# Patient Record
Sex: Male | Born: 1950 | Race: White | Hispanic: No | Smoking: Former smoker
Health system: Southern US, Community
[De-identification: ages and names within clinical notes are randomized; demographics above are authoritative.]

## PROBLEM LIST (undated history)

## (undated) DIAGNOSIS — I10 Essential (primary) hypertension: Secondary | ICD-10-CM

## (undated) DIAGNOSIS — Z87891 Personal history of nicotine dependence: Secondary | ICD-10-CM

## (undated) DIAGNOSIS — G4730 Sleep apnea, unspecified: Secondary | ICD-10-CM

## (undated) DIAGNOSIS — I214 Non-ST elevation (NSTEMI) myocardial infarction: Secondary | ICD-10-CM

## (undated) DIAGNOSIS — E785 Hyperlipidemia, unspecified: Secondary | ICD-10-CM

## (undated) HISTORY — DX: Personal history of nicotine dependence: Z87.891

## (undated) HISTORY — DX: Essential (primary) hypertension: I10

## (undated) HISTORY — DX: Hyperlipidemia, unspecified: E78.5

---

## 2017-03-19 ENCOUNTER — Emergency Department (HOSPITAL_COMMUNITY): Payer: Self-pay

## 2017-03-19 ENCOUNTER — Inpatient Hospital Stay (HOSPITAL_COMMUNITY)
Admission: EM | Admit: 2017-03-19 | Discharge: 2017-03-21 | DRG: 247 | Disposition: A | Discharge status: Home / Self Care | Payer: Self-pay | Attending: Cardiovascular Disease | Admitting: Cardiovascular Disease

## 2017-03-19 ENCOUNTER — Other Ambulatory Visit: Payer: Self-pay

## 2017-03-19 DIAGNOSIS — E785 Hyperlipidemia, unspecified: Secondary | ICD-10-CM | POA: Diagnosis present

## 2017-03-19 DIAGNOSIS — Z79899 Other long term (current) drug therapy: Secondary | ICD-10-CM

## 2017-03-19 DIAGNOSIS — F17210 Nicotine dependence, cigarettes, uncomplicated: Secondary | ICD-10-CM | POA: Diagnosis present

## 2017-03-19 DIAGNOSIS — I10 Essential (primary) hypertension: Secondary | ICD-10-CM | POA: Diagnosis present

## 2017-03-19 DIAGNOSIS — I214 Non-ST elevation (NSTEMI) myocardial infarction: Secondary | ICD-10-CM

## 2017-03-19 DIAGNOSIS — I2510 Atherosclerotic heart disease of native coronary artery without angina pectoris: Secondary | ICD-10-CM | POA: Diagnosis present

## 2017-03-19 DIAGNOSIS — Z87891 Personal history of nicotine dependence: Secondary | ICD-10-CM | POA: Diagnosis present

## 2017-03-19 DIAGNOSIS — Z91030 Bee allergy status: Secondary | ICD-10-CM

## 2017-03-19 DIAGNOSIS — Z8249 Family history of ischemic heart disease and other diseases of the circulatory system: Secondary | ICD-10-CM

## 2017-03-19 DIAGNOSIS — I472 Ventricular tachycardia: Secondary | ICD-10-CM | POA: Diagnosis not present

## 2017-03-19 HISTORY — DX: Non-ST elevation (NSTEMI) myocardial infarction: I21.4

## 2017-03-19 HISTORY — DX: Sleep apnea, unspecified: G47.30

## 2017-03-19 LAB — BASIC METABOLIC PANEL WITH GFR
Anion gap: 13 (ref 5–15)
BUN: 14 mg/dL (ref 6–20)
CO2: 20 mmol/L — ABNORMAL LOW (ref 22–32)
Calcium: 8.7 mg/dL — ABNORMAL LOW (ref 8.9–10.3)
Chloride: 100 mmol/L — ABNORMAL LOW (ref 101–111)
Creatinine, Ser: 0.98 mg/dL (ref 0.61–1.24)
GFR calc Af Amer: >60 mL/min
GFR calc non Af Amer: >60 mL/min
Glucose, Bld: 113 mg/dL — ABNORMAL HIGH (ref 65–99)
Potassium: 3.9 mmol/L (ref 3.5–5.1)
Sodium: 133 mmol/L — ABNORMAL LOW (ref 135–145)

## 2017-03-19 LAB — CBC
HCT: 40.9 % (ref 39.0–52.0)
Hemoglobin: 14.1 g/dL (ref 13.0–17.0)
MCH: 33.1 pg (ref 26.0–34.0)
MCHC: 34.5 g/dL (ref 30.0–36.0)
MCV: 96 fL (ref 78.0–100.0)
Platelets: 226 K/uL (ref 150–400)
RBC: 4.26 MIL/uL (ref 4.22–5.81)
RDW: 13.4 % (ref 11.5–15.5)
WBC: 8.7 K/uL (ref 4.0–10.5)

## 2017-03-19 LAB — I-STAT TROPONIN, ED: Troponin i, poc: 2.85 ng/mL (ref 0.00–0.08)

## 2017-03-19 MED ORDER — LISINOPRIL 5 MG PO TABS
5.0000 mg | ORAL_TABLET | Freq: Every day | ORAL | Status: DC
  Administered 2017-03-21: 09:00:00 5 mg via ORAL
  Filled 2017-03-19: qty 1

## 2017-03-19 MED ORDER — NITROGLYCERIN 0.4 MG SL SUBL
0.4000 mg | SUBLINGUAL_TABLET | SUBLINGUAL | Status: DC | PRN
  Administered 2017-03-19 – 2017-03-20 (×2): 0.4 mg via SUBLINGUAL
  Filled 2017-03-19: qty 1

## 2017-03-19 MED ORDER — METOPROLOL TARTRATE 12.5 MG HALF TABLET
12.5000 mg | ORAL_TABLET | Freq: Two times a day (BID) | ORAL | Status: DC
  Administered 2017-03-20 (×2): 12.5 mg via ORAL
  Filled 2017-03-19 (×2): qty 1

## 2017-03-19 MED ORDER — ONDANSETRON HCL 4 MG/2ML IJ SOLN: 4.0000 mg | Freq: Four times a day (QID) | INTRAMUSCULAR | Status: DC | PRN

## 2017-03-19 MED ORDER — NICOTINE 21 MG/24HR TD PT24
21.0000 mg | MEDICATED_PATCH | Freq: Every day | TRANSDERMAL | Status: DC
  Administered 2017-03-20 – 2017-03-21 (×3): 21 mg via TRANSDERMAL
  Filled 2017-03-19 (×3): qty 1

## 2017-03-19 MED ORDER — ASPIRIN 81 MG PO CHEW
324.0000 mg | CHEWABLE_TABLET | Freq: Once | ORAL | Status: AC
  Administered 2017-03-19: 324 mg via ORAL
  Filled 2017-03-19: qty 4

## 2017-03-19 MED ORDER — ASPIRIN EC 81 MG PO TBEC
81.0000 mg | DELAYED_RELEASE_TABLET | Freq: Every day | ORAL | Status: DC
  Administered 2017-03-21: 09:00:00 81 mg via ORAL
  Filled 2017-03-19: qty 1

## 2017-03-19 MED ORDER — HEPARIN BOLUS VIA INFUSION
4000.0000 [IU] | Freq: Once | INTRAVENOUS | Status: AC
  Administered 2017-03-19: 4000 [IU] via INTRAVENOUS
  Filled 2017-03-19: qty 4000

## 2017-03-19 MED ORDER — ACETAMINOPHEN 325 MG PO TABS
650.0000 mg | ORAL_TABLET | ORAL | Status: DC | PRN
Start: 2017-03-19 — End: 2017-03-21

## 2017-03-19 MED ORDER — HEPARIN (PORCINE) IN NACL 100-0.45 UNIT/ML-% IJ SOLN
1500.0000 [IU]/h | INTRAMUSCULAR | Status: DC
  Administered 2017-03-19: 1250 [IU]/h via INTRAVENOUS
  Filled 2017-03-19 (×2): qty 250

## 2017-03-19 MED ORDER — ATORVASTATIN CALCIUM 80 MG PO TABS
80.0000 mg | ORAL_TABLET | Freq: Every day | ORAL | Status: DC
  Administered 2017-03-20: 80 mg via ORAL
  Filled 2017-03-19 (×2): qty 1

## 2017-03-19 NOTE — ED Provider Notes (Signed)
[facility] EMERGENCY DEPARTMENT Provider Note   CSN: [id number] Arrival date & time: 03/19/17  1948     History   Chief Complaint Chief Complaint  Patient presents with  . Chest Pain    HPI LEIB ELAHI is a 67 y.o. male.  HPI  67 year old male presents with chest pain and diaphoresis.  Started around 2 PM this afternoon while at work.  The diaphoresis and chest discomfort started the same time and was rated as a 6 or 7 out of.  He has a hard time describing it but it felt uncomfortable and somewhat squeezing.  No associated shortness of breath or nausea/vomiting but he did have some pain in between his scapula.  The pain has improved and is now about a 4 out of 10 but is still present.  Diaphoresis has resolved.  He smokes but denies any other significant past medical history but also has not seen a doctor in 15 years.  His dad died of an MI in his 45s.  Patient took some Excedrin that had aspirin in it earlier today.  No past medical history on file.  Patient Active Problem List   Diagnosis Date Noted  . NSTEMI (non-ST elevated myocardial infarction) (HCC) 03/19/2017        Home Medications    Prior to Admission medications   Medication Sig Start Date End Date Taking? Authorizing Provider  aspirin-acetaminophen-caffeine (EXCEDRIN MIGRAINE) [phone] MG tablet Take 1 tablet by mouth every 6 (six) hours as needed for headache.   Yes [provider]    Family History No family history on file.  Social History Social History   Tobacco Use  . Smoking status: Not on file  Substance Use Topics  . Alcohol use: Not on file  . Drug use: Not on file     Allergies   Bee venom   Review of Systems Review of Systems  Constitutional: Positive for diaphoresis.  Respiratory: Negative for shortness of breath.   Cardiovascular: Positive for chest pain. Negative for leg swelling.  Gastrointestinal: Negative for nausea and vomiting.    Musculoskeletal: Positive for back pain.  All other systems reviewed and are negative.    Physical Exam Updated Vital Signs BP 138/80 (BP Location: Right Arm)   Temp 98.2 F (36.8 C) (Oral)   Resp 18   Ht 6' (1.829 m)   Wt 93 kg (205 lb)   SpO2 98%   BMI 27.80 kg/m   Physical Exam  Constitutional: He is oriented to person, place, and time. He appears well-developed and well-nourished.  Non-toxic appearance. He does not appear ill. No distress.  HENT:  Head: Normocephalic and atraumatic.  Right Ear: External ear normal.  Left Ear: External ear normal.  Nose: Nose normal.  Eyes: Right eye exhibits no discharge. Left eye exhibits no discharge.  Neck: Neck supple.  Cardiovascular: Normal rate, regular rhythm and normal heart sounds.  Pulses:      Radial pulses are 2+ on the right side, and 2+ on the left side.  Pulmonary/Chest: Effort normal and breath sounds normal.  Abdominal: Soft. There is no tenderness.  Musculoskeletal: He exhibits no edema.       Right lower leg: He exhibits no edema.       Left lower leg: He exhibits no edema.  Neurological: He is alert and oriented to person, place, and time.  Skin: Skin is warm and dry.  Nursing note and vitals reviewed.    ED Treatments /  Results  Labs (all labs ordered are listed, but only abnormal results are displayed) Labs Reviewed  BASIC METABOLIC PANEL - Abnormal; Notable for the following components:      Result Value   Sodium 133 (*)    Chloride 100 (*)    CO2 20 (*)    Glucose, Bld 113 (*)    Calcium 8.7 (*)    All other components within normal limits  I-STAT TROPONIN, ED - Abnormal; Notable for the following components:   Troponin i, poc 2.85 (*)    All other components within normal limits  CBC  HEPARIN LEVEL (UNFRACTIONATED)  CBC  HIV ANTIBODY (ROUTINE TESTING)  BASIC METABOLIC PANEL  LIPID PANEL  PROTIME-INR    EKG  EKG Interpretation  Date/Time:  Thursday March 19 2017 19:52:30  EST Ventricular Rate:  67 PR Interval:  158 QRS Duration: 96 QT Interval:  384 QTC Calculation: 405 R Axis:   28 Text Interpretation:  Normal sinus rhythm Possible Inferior infarct , age undetermined Abnormal ECG No old tracing to compare Confirmed by Pricilla LovelessGoldston, Kendahl Bumgardner [phone]([id number]) on 03/19/2017 8:48:53 PM       EKG Interpretation  Date/Time:  Thursday March 19 2017 20:56:58 EST Ventricular Rate:  69 PR Interval:  158 QRS Duration: 105 QT Interval:  394 QTC Calculation: 423 R Axis:   31 Text Interpretation:  Sinus rhythm Abnormal inferior Q waves no significant change since earlier in the day Confirmed by Pricilla LovelessGoldston, Suann Klier [phone]([id number]) on 03/19/2017 9:09:03 PM        Radiology Dg Chest Portable 1 View  Result Date: 03/19/2017 CLINICAL DATA:  Acute onset of midsternal chest pain, diaphoresis and shortness of breath. EXAM: PORTABLE CHEST 1 VIEW COMPARISON:  None. FINDINGS: The lungs are well-aerated. Minimal left basilar opacity may reflect atelectasis. There is no evidence of pleural effusion or pneumothorax. The cardiomediastinal silhouette is within normal limits. No acute osseous abnormalities are seen. IMPRESSION: Minimal left basilar opacity may reflect atelectasis. Electronically Signed   By: Roanna RaiderJeffery  Chang M.D.   On: 03/19/2017 21:37    Procedures .Critical Care Performed by: Pricilla LovelessGoldston, Mercadies Co, MD Authorized by: Pricilla LovelessGoldston, Daishaun Ayre, MD   Critical care provider statement:    Critical care time (minutes):  30   Critical care was necessary to treat or prevent imminent or life-threatening deterioration of the following conditions:  Cardiac failure and circulatory failure   Critical care was time spent personally by me on the following activities:  Development of treatment plan with patient or surrogate   (including critical care time)  Medications Ordered in ED Medications  nitroGLYCERIN (NITROSTAT) SL tablet 0.4 mg (0.4 mg Sublingual Given 03/19/17 2113)  heparin ADULT infusion 100 units/mL  (25000 units/25150mL sodium chloride 0.45%) (1,250 Units/hr Intravenous New Bag/Given 03/19/17 2130)  aspirin EC tablet 81 mg (not administered)  acetaminophen (TYLENOL) tablet 650 mg (not administered)  ondansetron (ZOFRAN) injection 4 mg (not administered)  atorvastatin (LIPITOR) tablet 80 mg (not administered)  metoprolol tartrate (LOPRESSOR) tablet 12.5 mg (not administered)  lisinopril (PRINIVIL,ZESTRIL) tablet 5 mg (not administered)  aspirin chewable tablet 324 mg (324 mg Oral Given 03/19/17 2111)  heparin bolus via infusion 4,000 Units (4,000 Units Intravenous Bolus from Bag 03/19/17 2130)     Initial Impression / Assessment and Plan / ED Course  I have reviewed the triage vital signs and the nursing notes.  Pertinent labs & imaging results that were available during my care of the patient were reviewed by me and considered in my medical decision making (  see chart for details).     Patient's presentation is consistent with an NSTEMI.  He did have some back pain but it was not severe and did not seem like a tearing pain coming from his chest.  My suspicion is this is not a dissection.  He is moderately hypertensive with an initial blood pressure of around 140 systolic but has equal pulses bilaterally.  He does not appear in distress.  I think this is myocardial infarction instead.  After one nitroglycerin his pain has resolved.  He was also given 324 mg aspirin and started on heparin drip.  He is currently pain-free.  His ECG shows nonspecific changes but no STEMI or significant depressions.  Discussed with cardiology who will admit.  Final Clinical Impressions(s) / ED Diagnoses   Final diagnoses:  NSTEMI (non-ST elevated myocardial infarction) [facility])    ED Discharge Orders    None       Pricilla Loveless, MD 03/19/17 2228

## 2017-03-19 NOTE — ED Notes (Signed)
Xray bedside.

## 2017-03-19 NOTE — H&P (Signed)
Cardiology History & Physical    Patient ID: Garrett Robertson MRN: [id number], DOB: [birth year] Date of Encounter: 03/19/2017, 11:02 PM Primary Physician: Patient, No Pcp Per  Chief Complaint: Pain   HPI: Garrett Robertson is a 67 y.o. male smoker with no significant past medical history, presents with acute chest pain.  The patient was in his usual state of health until this afternoon when he was at rest, and noticed the onset of acute substernal chest pain accompanied by excessive diaphoresis.  The pain persisted for several hours, and he had intermittent episodes of significant diaphoresis.  At some point, the pain there was radiation of his pain between his shoulder blades, but it did not radiate elsewhere.  This the pain was also associated with mild dyspnea and intermittent lightheadedness.  He has never had an episode of chest discomfort like this in the past.  Due to the persistence of the pain over several hours the patient had his girlfriend drive him to the emergency [facility] ED for further evaluation.  Initial vital signs were notable for mild hypertension, but were otherwise within normal limits.  His labs showed sodium of 133 ,normal creatinine of 1.0, normal CBC, and a troponin of 2.85.  EKG showed borderline inferior Q waves without any obvious ST elevation.  He received 1 nitroglycerin with relief of his chest pain and was started on IV heparin.  Upon my interview, the patient denied any chest pain.  Home Meds: Prior to Admission medications   Medication Sig Start Date End Date Taking? Authorizing Provider  aspirin-acetaminophen-caffeine (EXCEDRIN MIGRAINE) [phone] MG tablet Take 1 tablet by mouth every 6 (six) hours as needed for headache.   Yes [provider]    Allergies:  Allergies  Allergen Reactions  . Bee Venom     Social History   Socioeconomic History  . Marital status: Legally Separated    Spouse name: Not on file  . Number of children: Not on file  . Years  of education: Not on file  . Highest education level: Not on file  Social Needs  . Financial resource strain: Not on file  . Food insecurity - worry: Not on file  . Food insecurity - inability: Not on file  . Transportation needs - medical: Not on file  . Transportation needs - non-medical: Not on file  Occupational History  . Not on file  Tobacco Use  . Smoking status: Not on file  Substance and Sexual Activity  . Alcohol use: Not on file  . Drug use: Not on file  . Sexual activity: Not on file  Other Topics Concern  . Not on file  Social History Narrative  . Not on file    Works as a Licensed conveyancer.  Smoked 1-1-1/2 packs a day for several decades.  Drinks about 1 case of beer per week.  No family history on file.  Review of Systems: All other systems reviewed and are otherwise negative except as noted above.  Labs:   Lab Results  Component Value Date   WBC 8.7 03/19/2017   HGB 14.1 03/19/2017   HCT 40.9 03/19/2017   MCV 96.0 03/19/2017   PLT 226 03/19/2017    Recent Labs  Lab 03/19/17 2007  NA 133*  K 3.9  CL 100*  CO2 20*  BUN 14  CREATININE 0.98  CALCIUM 8.7*  GLUCOSE 113*   No results for input(s): CKTOTAL, CKMB, TROPONINI in the last 72 hours. No results found  for: CHOL, HDL, LDLCALC, TRIG No results found for: DDIMER  Radiology/Studies:  Dg Chest Portable 1 View  Result Date: 03/19/2017 CLINICAL DATA:  Acute onset of midsternal chest pain, diaphoresis and shortness of breath. EXAM: PORTABLE CHEST 1 VIEW COMPARISON:  None. FINDINGS: The lungs are well-aerated. Minimal left basilar opacity may reflect atelectasis. There is no evidence of pleural effusion or pneumothorax. The cardiomediastinal silhouette is within normal limits. No acute osseous abnormalities are seen. IMPRESSION: Minimal left basilar opacity may reflect atelectasis. Electronically Signed   By: Roanna RaiderJeffery  Chang M.D.   On: 03/19/2017 21:37   Wt Readings from Last 3 Encounters:  03/19/17 93 kg  (205 lb)    EKG: Normal sinus rhythm, borderline inferior Q waves.  Non-specific ST and T wave changes.  Physical Exam: Blood pressure (!) 147/97, pulse 82, temperature 98.2 F (36.8 C), temperature source Oral, resp. rate (!) 21, height 6' (1.829 m), weight 93 kg (205 lb), SpO2 96 %. Body mass index is 27.8 kg/m. General: Well developed, well nourished, in no acute distress. Head: Normocephalic, atraumatic, sclera non-icteric, no xanthomas, nares are without discharge.  Neck: Negative for carotid bruits. JVD not elevated. Lungs: Clear bilaterally to auscultation without wheezes, rales, or rhonchi. Breathing is unlabored. Heart: RRR with S1 S2. No murmurs, rubs, or gallops appreciated. Abdomen: Soft, non-tender, non-distended with normoactive bowel sounds. No hepatomegaly. No rebound/guarding. No obvious abdominal masses. Msk:  Strength and tone appear normal for age. Extremities: No clubbing or cyanosis. No edema.  Distal pedal pulses are 2+ and equal bilaterally. Neuro: Alert and oriented X 3. No focal deficit. No facial asymmetry. Moves all extremities spontaneously. Psych:  Responds to questions appropriately with a normal affect.    Assessment and Plan  67 y.o. male smoker with no significant past medical history, presents with acute chest pain, found to have NSTEMI.  1.  NSTEMI: Chest pain-free at present.  Continue IV heparin drip.  Plan for cardiac catheterization in the morning or sooner if he has recurrent chest pain refractory to medical management.  Echo ordered.  Continue aspirin, start high-dose atorvastatin, start low-dose metoprolol and ACE inhibitor.  2.  Active smoking: Nicotine supplementation for now, consider pharmacologic aid in smoking cessation upon discharge.   Signed, Garrett PlantsJaidip Elvina Bosch, MD 03/19/2017, 11:02 PM

## 2017-03-19 NOTE — ED Notes (Signed)
Cardiology @ bedside.

## 2017-03-19 NOTE — ED Triage Notes (Signed)
Pt reports midsternal chest pain, sweating, and shortness of breath, that started around 2pm today. Described as squeezing with radiation to his back. Pt took 2 325 ASA at home before he came to the hospital.

## 2017-03-19 NOTE — Progress Notes (Signed)
ANTICOAGULATION CONSULT NOTE - Initial Consult  Pharmacy Consult for heparin Indication: chest pain/ACS  Allergies  Allergen Reactions  . Bee Venom     Patient Measurements: Height: 6' (182.9 cm) Weight: 205 lb (93 kg) IBW/kg (Calculated) : 77.6 Heparin Dosing Weight: 93 Kg  Vital Signs: Temp: 98.2 F (36.8 C) (02/07 1957) Temp Source: Oral (02/07 1957) BP: 138/80 (02/07 1957)  Labs: Recent Labs    03/19/17 2007  HGB 14.1  HCT 40.9  PLT 226    CrCl cannot be calculated (No order found.).   Medical History: No past medical history on file.  Assessment: Garrett Robertson who has not been to the doctor in over a decade and not taking any medications PTA presents to the Ed with chest pain. Trop 2.85, CBC WNL; pharmacy to dose heparin  Goal of Therapy:  Heparin level 0.3-0.7 units/ml Monitor platelets by anticoagulation protocol: Yes   Plan:  Give 4000 units bolus x 1 Start heparin infusion at 1250 units/hr Check anti-Xa level in 6 hours and daily while on heparin Continue to monitor H&H and platelets  Garrett Robertson L Garrett Robertson 03/19/2017,9:11 PM

## 2017-03-20 ENCOUNTER — Encounter (HOSPITAL_COMMUNITY): Payer: Self-pay | Admitting: Cardiology

## 2017-03-20 ENCOUNTER — Other Ambulatory Visit (HOSPITAL_COMMUNITY): Payer: Self-pay

## 2017-03-20 ENCOUNTER — Encounter (HOSPITAL_COMMUNITY)
Admission: EM | Disposition: A | Discharge status: Home / Self Care | Payer: Self-pay | Source: Home / Self Care | Attending: Cardiology

## 2017-03-20 DIAGNOSIS — E782 Mixed hyperlipidemia: Secondary | ICD-10-CM

## 2017-03-20 DIAGNOSIS — Z720 Tobacco use: Secondary | ICD-10-CM

## 2017-03-20 DIAGNOSIS — I2510 Atherosclerotic heart disease of native coronary artery without angina pectoris: Secondary | ICD-10-CM

## 2017-03-20 DIAGNOSIS — I214 Non-ST elevation (NSTEMI) myocardial infarction: Principal | ICD-10-CM

## 2017-03-20 HISTORY — PX: CORONARY STENT INTERVENTION: CATH118234

## 2017-03-20 HISTORY — PX: CORONARY ANGIOPLASTY WITH STENT PLACEMENT: SHX49

## 2017-03-20 HISTORY — PX: LEFT HEART CATH AND CORONARY ANGIOGRAPHY: CATH118249

## 2017-03-20 LAB — POCT ACTIVATED CLOTTING TIME: Activated Clotting Time: 246 seconds

## 2017-03-20 LAB — BASIC METABOLIC PANEL
Anion gap: 10 (ref 5–15)
BUN: 9 mg/dL (ref 6–20)
CO2: 22 mmol/L (ref 22–32)
Calcium: 8.7 mg/dL — ABNORMAL LOW (ref 8.9–10.3)
Chloride: 105 mmol/L (ref 101–111)
Creatinine, Ser: 0.93 mg/dL (ref 0.61–1.24)
GFR calc Af Amer: >60 mL/min (ref >60)
GFR calc non Af Amer: >60 mL/min (ref >60)
Glucose, Bld: 107 mg/dL — ABNORMAL HIGH (ref 65–99)
Potassium: 3.7 mmol/L (ref 3.5–5.1)
Sodium: 137 mmol/L (ref 135–145)

## 2017-03-20 LAB — PROTIME-INR
INR: 0.96
Prothrombin Time: 12.7 seconds (ref 11.4–15.2)

## 2017-03-20 LAB — LIPID PANEL
Cholesterol: 178 mg/dL (ref 0–200)
HDL: 40 mg/dL — ABNORMAL LOW (ref >40)
LDL Cholesterol: 123 mg/dL — ABNORMAL HIGH (ref 0–99)
Total CHOL/HDL Ratio: 4.5 RATIO
Triglycerides: 77 mg/dL (ref <150)
VLDL: 15 mg/dL (ref 0–40)

## 2017-03-20 LAB — CBC
HCT: 40.3 % (ref 39.0–52.0)
Hemoglobin: 13.8 g/dL (ref 13.0–17.0)
MCH: 32.7 pg (ref 26.0–34.0)
MCHC: 34.2 g/dL (ref 30.0–36.0)
MCV: 95.5 fL (ref 78.0–100.0)
PLATELETS: 209 10*3/uL (ref 150–400)
RBC: 4.22 MIL/uL (ref 4.22–5.81)
RDW: 13.2 % (ref 11.5–15.5)
WBC: 8.3 10*3/uL (ref 4.0–10.5)

## 2017-03-20 LAB — HIV ANTIBODY (ROUTINE TESTING W REFLEX): HIV Screen 4th Generation wRfx: NONREACTIVE

## 2017-03-20 LAB — HEPARIN LEVEL (UNFRACTIONATED): Heparin Unfractionated: 0.18 IU/mL — ABNORMAL LOW (ref 0.30–0.70)

## 2017-03-20 SURGERY — LEFT HEART CATH AND CORONARY ANGIOGRAPHY
Anesthesia: LOCAL

## 2017-03-20 MED ORDER — SODIUM CHLORIDE 0.9 % WEIGHT BASED INFUSION
1.0000 mL/kg/h | INTRAVENOUS | Status: AC
  Administered 2017-03-20: 1 mL/kg/h via INTRAVENOUS

## 2017-03-20 MED ORDER — HEPARIN BOLUS VIA INFUSION
3000.0000 [IU] | Freq: Once | INTRAVENOUS | Status: AC
  Administered 2017-03-20: 3000 [IU] via INTRAVENOUS
  Filled 2017-03-20: qty 3000

## 2017-03-20 MED ORDER — VERAPAMIL HCL 2.5 MG/ML IV SOLN
INTRAVENOUS | Status: DC | PRN
  Administered 2017-03-20: 10 mL via INTRA_ARTERIAL

## 2017-03-20 MED ORDER — LIDOCAINE HCL 1 % IJ SOLN
INTRAMUSCULAR | Status: AC
  Filled 2017-03-20: qty 20

## 2017-03-20 MED ORDER — FENTANYL CITRATE (PF) 100 MCG/2ML IJ SOLN
INTRAMUSCULAR | Status: DC | PRN
  Administered 2017-03-20: 25 ug via INTRAVENOUS

## 2017-03-20 MED ORDER — NITROGLYCERIN 1 MG/10 ML FOR IR/CATH LAB
INTRA_ARTERIAL | Status: DC | PRN
  Administered 2017-03-20: 100 ug via INTRACORONARY
  Administered 2017-03-20: 200 ug via INTRACORONARY

## 2017-03-20 MED ORDER — MIDAZOLAM HCL 2 MG/2ML IJ SOLN
INTRAMUSCULAR | Status: DC | PRN
  Administered 2017-03-20: 2 mg via INTRAVENOUS

## 2017-03-20 MED ORDER — IOHEXOL 350 MG/ML SOLN
INTRAVENOUS | Status: DC | PRN
  Administered 2017-03-20: 100 mL via INTRA_ARTERIAL

## 2017-03-20 MED ORDER — HEPARIN SODIUM (PORCINE) 1000 UNIT/ML IJ SOLN
INTRAMUSCULAR | Status: DC | PRN
  Administered 2017-03-20: 5000 [IU] via INTRAVENOUS
  Administered 2017-03-20: 4000 [IU] via INTRAVENOUS
  Administered 2017-03-20: 2000 [IU] via INTRAVENOUS

## 2017-03-20 MED ORDER — LIDOCAINE HCL (PF) 1 % IJ SOLN
INTRAMUSCULAR | Status: DC | PRN
  Administered 2017-03-20: 1 mL

## 2017-03-20 MED ORDER — MIDAZOLAM HCL 2 MG/2ML IJ SOLN
INTRAMUSCULAR | Status: AC
  Filled 2017-03-20: qty 2

## 2017-03-20 MED ORDER — SODIUM CHLORIDE 0.9% FLUSH: 3.0000 mL | INTRAVENOUS | Status: DC | PRN

## 2017-03-20 MED ORDER — IOPAMIDOL (ISOVUE-370) INJECTION 76%
INTRAVENOUS | Status: DC | PRN
  Administered 2017-03-20: 40 mL via INTRA_ARTERIAL

## 2017-03-20 MED ORDER — HEPARIN (PORCINE) IN NACL 2-0.9 UNIT/ML-% IJ SOLN
INTRAMUSCULAR | Status: AC | PRN
  Administered 2017-03-20: 1000 mL

## 2017-03-20 MED ORDER — TICAGRELOR 90 MG PO TABS
90.0000 mg | ORAL_TABLET | Freq: Two times a day (BID) | ORAL | Status: DC
  Administered 2017-03-20 – 2017-03-21 (×2): 90 mg via ORAL
  Filled 2017-03-20 (×2): qty 1

## 2017-03-20 MED ORDER — HEPARIN (PORCINE) IN NACL 2-0.9 UNIT/ML-% IJ SOLN
INTRAMUSCULAR | Status: AC
  Filled 2017-03-20: qty 1000

## 2017-03-20 MED ORDER — SODIUM CHLORIDE 0.9% FLUSH
3.0000 mL | Freq: Two times a day (BID) | INTRAVENOUS | Status: DC
  Administered 2017-03-20: 3 mL via INTRAVENOUS

## 2017-03-20 MED ORDER — SODIUM CHLORIDE 0.9 % IV SOLN: 250.0000 mL | INTRAVENOUS | Status: DC | PRN

## 2017-03-20 MED ORDER — TICAGRELOR 90 MG PO TABS
ORAL_TABLET | ORAL | Status: AC
  Filled 2017-03-20: qty 2

## 2017-03-20 MED ORDER — NITROGLYCERIN 1 MG/10 ML FOR IR/CATH LAB
INTRA_ARTERIAL | Status: AC
  Filled 2017-03-20: qty 10

## 2017-03-20 MED ORDER — HEPARIN SODIUM (PORCINE) 1000 UNIT/ML IJ SOLN
INTRAMUSCULAR | Status: AC
  Filled 2017-03-20: qty 1

## 2017-03-20 MED ORDER — VERAPAMIL HCL 2.5 MG/ML IV SOLN
INTRAVENOUS | Status: AC
  Filled 2017-03-20: qty 2

## 2017-03-20 MED ORDER — HYDRALAZINE HCL 20 MG/ML IJ SOLN: 5.0000 mg | INTRAMUSCULAR | Status: AC | PRN

## 2017-03-20 MED ORDER — IOPAMIDOL (ISOVUE-370) INJECTION 76%
INTRAVENOUS | Status: AC
  Filled 2017-03-20: qty 50

## 2017-03-20 MED ORDER — FENTANYL CITRATE (PF) 100 MCG/2ML IJ SOLN
INTRAMUSCULAR | Status: AC
  Filled 2017-03-20: qty 2

## 2017-03-20 MED ORDER — TICAGRELOR 90 MG PO TABS
ORAL_TABLET | ORAL | Status: DC | PRN
  Administered 2017-03-20: 180 mg via ORAL

## 2017-03-20 SURGICAL SUPPLY — 13 items
BALLN SAPPHIRE 2.5X15 (BALLOONS) ×2
BALLN SAPPHIRE ~~LOC~~ 3.5X12 (BALLOONS) ×2
CATH 5FR JL3.5 JR4 ANG PIG MP (CATHETERS) ×2
CATH VISTA GUIDE 6FR AL1 (CATHETERS) ×2
INQWIRE 1.5J .035X260CM (WIRE) ×2
KIT ENCORE 26 ADVANTAGE (KITS) ×2
KIT HEART LEFT (KITS) ×2
PACK CARDIAC CATHETERIZATION (CUSTOM PROCEDURE TRAY) ×2
STENT SYNERGY DES 3X16 (Permanent Stent) ×2 IMPLANT
SYR MEDRAD MARK V 150ML (SYRINGE) ×2
TRANSDUCER W/STOPCOCK (MISCELLANEOUS) ×2
TUBING CIL FLEX 10 FLL-RA (TUBING) ×2
WIRE ASAHI PROWATER 180CM (WIRE) ×2

## 2017-03-20 NOTE — Interval H&P Note (Signed)
History and Physical Interval Note:  03/20/2017 9:38 AM  Garrett GarlandJohn H Purdie  has presented today for surgery, with the diagnosis of NSTEMI  The various methods of treatment have been discussed with the patient and family. After consideration of risks, benefits and other options for treatment, the patient has consented to  Procedure(s): LEFT HEART CATH AND CORONARY ANGIOGRAPHY (N/A) as a surgical intervention .  The patient's history has been reviewed, patient examined, no change in status, stable for surgery.  I have reviewed the patient's chart and labs.  Questions were answered to the patient's satisfaction.    Cath Lab Visit (complete for each Cath Lab visit)  Clinical Evaluation Leading to the Procedure:   ACS: Yes.    Non-ACS:    Anginal Classification: CCS IV  Anti-ischemic medical therapy: No Therapy  Non-Invasive Test Results: No non-invasive testing performed  Prior CABG: No previous CABG       Theron Aristaeter Summit Surgical Asc LLCJordanMD,FACC 03/20/2017 9:38 AM

## 2017-03-20 NOTE — Progress Notes (Signed)
Progress Note  Patient Name: Garrett Robertson Garrett Robertson Date of Encounter: 03/20/2017  Primary Cardiologist: No primary care provider on file.   Subjective   Chest pain is much better now.  It was very severe upon presentation but eased significantly.  Currently pain free.  Inpatient Medications    Scheduled Meds: . aspirin EC  81 mg Oral Daily  . atorvastatin  80 mg Oral q1800  . lisinopril  5 mg Oral Daily  . metoprolol tartrate  12.5 mg Oral BID  . nicotine  21 mg Transdermal Daily   Continuous Infusions: . heparin 1,500 Units/hr (03/20/17 0509)   PRN Meds: acetaminophen, nitroGLYCERIN, ondansetron (ZOFRAN) IV   Vital Signs    Vitals:   03/20/17 0300 03/20/17 0400 03/20/17 0500 03/20/17 0600  BP: 121/84 113/85 131/70 119/76  Pulse: 67 65 61 63  Resp: 11 19 (!) 23 (!) 22  Temp:      TempSrc:      SpO2: 98% 96% 97% 94%  Weight:      Height:       No intake or output data in the 24 hours ending 03/20/17 0840 Filed Weights   03/19/17 1955  Weight: 205 lb (93 kg)    Telemetry    NSR - Personally Reviewed  ECG    NSR, no ST changes- Personally Reviewed  Physical Exam   GEN: No acute distress.   Neck: No JVD Cardiac: RRR, no murmurs, rubs, or gallops. 2+ right radial pulse Respiratory: Clear to auscultation bilaterally. GI: Soft, nontender, non-distended  MS: No edema; No deformity. Neuro:  Nonfocal  Psych: Normal affect   Labs    Chemistry Recent Labs  Lab 03/19/17 2007 03/20/17 0359  NA 133* 137  K 3.9 3.7  CL 100* 105  CO2 20* 22  GLUCOSE 113* 107*  BUN 14 9  CREATININE 0.98 0.93  CALCIUM 8.7* 8.7*  GFRNONAA >60 >60  GFRAA >60 >60  ANIONGAP 13 10     Hematology Recent Labs  Lab 03/19/17 2007 03/20/17 0359  WBC 8.7 8.3  RBC 4.26 4.22  HGB 14.1 13.8  HCT 40.9 40.3  MCV 96.0 95.5  MCH 33.1 32.7  MCHC 34.5 34.2  RDW 13.4 13.2  PLT 226 209    Cardiac EnzymesNo results for input(s): TROPONINI in the last 168 hours.  Recent Labs  Lab  03/19/17 2030  TROPIPOC 2.85*     BNPNo results for input(s): BNP, PROBNP in the last 168 hours.   DDimer No results for input(s): DDIMER in the last 168 hours.   Radiology    Dg Chest Portable 1 View  Result Date: 03/19/2017 CLINICAL DATA:  Acute onset of midsternal chest pain, diaphoresis and shortness of breath. EXAM: PORTABLE CHEST 1 VIEW COMPARISON:  None. FINDINGS: The lungs are well-aerated. Minimal left basilar opacity may reflect atelectasis. There is no evidence of pleural effusion or pneumothorax. The cardiomediastinal silhouette is within normal limits. No acute osseous abnormalities are seen. IMPRESSION: Minimal left basilar opacity may reflect atelectasis. Electronically Signed   By: Roanna RaiderJeffery  Chang M.D.   On: 03/19/2017 21:37    Cardiac Studies   Elevated cardiac enzymes  Patient Profile     67 y.o. male with NSTEMI  Assessment & Plan    1) NSTEMI:  THe risks and benefits of cath were explained to the patient.  He is agreeable.  COntinue IV heparin.    Cardiac catheterization was discussed with the patient fully. The patient understands that risks include  but are not limited to stroke (1 in 1000), death (1 in 1000), kidney failure [usually temporary] (1 in 500), bleeding (1 in 200), allergic reaction [possibly serious] (1 in 200).  The patient understands and is willing to proceed.    He needs to stop smoking.    LDL 123, will need high dose statin.   For questions or updates, please contact [facility] Please consult [email or web address] for contact info under Cardiology/STEMI.      Signed, Lance Muss, MD  03/20/2017, 8:40 AM

## 2017-03-20 NOTE — Care Management Note (Signed)
Case Management Note  Patient Details  Name: Ardeen GarlandJohn H Quintin MRN: [id number] Date of Birth: 06/03/1950  Subjective/Objective:   From home, s/p coronary stent intervention, will be on brilinta.  NCM gave patient the 30 day savings card and patient asst application and brochure for [facility] at 9:30.                    Action/Plan: NCM will follow for dc needs.   Expected Discharge Date:                  Expected Discharge Plan:  Home/Self Care  In-House Referral:     Discharge planning Services  CM Consult, Follow-up appt scheduled, Medication Assistance, Indigent Health Clinic  Post Acute Care Choice:    Choice offered to:     DME Arranged:    DME Agency:     HH Arranged:    HH Agency:     Status of Service:  Completed, signed off  If discussed at [employer] Length of Stay Meetings, dates discussed:    Additional Comments:  Leone Havenaylor, Jeniece Hannis Clinton, RN 03/20/2017, 4:58 PM

## 2017-03-20 NOTE — Progress Notes (Signed)
TR BAND REMOVAL  LOCATION:  right radial  DEFLATED PER PROTOCOL:  Yes.    TIME BAND OFF / DRESSING APPLIED:   1530   SITE UPON ARRIVAL:   Level 0  SITE AFTER BAND REMOVAL:  Level 0  CIRCULATION SENSATION AND MOVEMENT:  Within Normal Limits  Yes.    COMMENTS:    

## 2017-03-20 NOTE — Progress Notes (Signed)
ANTICOAGULATION CONSULT NOTE - Follow Up Consult  Pharmacy Consult for hrparin Indication: NSTEMI  Labs: Recent Labs    03/19/17 2007 03/20/17 0359  HGB 14.1 13.8  HCT 40.9 40.3  PLT 226 209  HEPARINUNFRC  --  0.18*  CREATININE 0.98  --     Assessment: 66yo male subtherapeutic on heparin with initial dosing for NSTEMI.  Goal of Therapy:  Heparin level 0.3-0.7 units/ml   Plan:  Will rebolus with heparin 3000 units and increase heparin gtt by 3 units/kg/hr to 1500 units/hr and check level in 6 hours.    Vernard GamblesVeronda Layah Skousen, PharmD, BCPS  03/20/2017,4:55 AM

## 2017-03-20 NOTE — H&P (View-Only) (Signed)
Progress Note  Patient Name: Garrett GarlandJohn H Robertson Date of Encounter: 03/20/2017  Primary Cardiologist: No primary care provider on file.   Subjective   Chest pain is much better now.  It was very severe upon presentation but eased significantly.  Currently pain free.  Inpatient Medications    Scheduled Meds: . aspirin EC  81 mg Oral Daily  . atorvastatin  80 mg Oral q1800  . lisinopril  5 mg Oral Daily  . metoprolol tartrate  12.5 mg Oral BID  . nicotine  21 mg Transdermal Daily   Continuous Infusions: . heparin 1,500 Units/hr (03/20/17 0509)   PRN Meds: acetaminophen, nitroGLYCERIN, ondansetron (ZOFRAN) IV   Vital Signs    Vitals:   03/20/17 0300 03/20/17 0400 03/20/17 0500 03/20/17 0600  BP: 121/84 113/85 131/70 119/76  Pulse: 67 65 61 63  Resp: 11 19 (!) 23 (!) 22  Temp:      TempSrc:      SpO2: 98% 96% 97% 94%  Weight:      Height:       No intake or output data in the 24 hours ending 03/20/17 0840 Filed Weights   03/19/17 1955  Weight: 205 lb (93 kg)    Telemetry    NSR - Personally Reviewed  ECG    NSR, no ST changes- Personally Reviewed  Physical Exam   GEN: No acute distress.   Neck: No JVD Cardiac: RRR, no murmurs, rubs, or gallops. 2+ right radial pulse Respiratory: Clear to auscultation bilaterally. GI: Soft, nontender, non-distended  MS: No edema; No deformity. Neuro:  Nonfocal  Psych: Normal affect   Labs    Chemistry Recent Labs  Lab 03/19/17 2007 03/20/17 0359  NA 133* 137  K 3.9 3.7  CL 100* 105  CO2 20* 22  GLUCOSE 113* 107*  BUN 14 9  CREATININE 0.98 0.93  CALCIUM 8.7* 8.7*  GFRNONAA >60 >60  GFRAA >60 >60  ANIONGAP 13 10     Hematology Recent Labs  Lab 03/19/17 2007 03/20/17 0359  WBC 8.7 8.3  RBC 4.26 4.22  HGB 14.1 13.8  HCT 40.9 40.3  MCV 96.0 95.5  MCH 33.1 32.7  MCHC 34.5 34.2  RDW 13.4 13.2  PLT 226 209    Cardiac EnzymesNo results for input(s): TROPONINI in the last 168 hours.  Recent Labs  Lab  03/19/17 2030  TROPIPOC 2.85*     BNPNo results for input(s): BNP, PROBNP in the last 168 hours.   DDimer No results for input(s): DDIMER in the last 168 hours.   Radiology    Dg Chest Portable 1 View  Result Date: 03/19/2017 CLINICAL DATA:  Acute onset of midsternal chest pain, diaphoresis and shortness of breath. EXAM: PORTABLE CHEST 1 VIEW COMPARISON:  None. FINDINGS: The lungs are well-aerated. Minimal left basilar opacity may reflect atelectasis. There is no evidence of pleural effusion or pneumothorax. The cardiomediastinal silhouette is within normal limits. No acute osseous abnormalities are seen. IMPRESSION: Minimal left basilar opacity may reflect atelectasis. Electronically Signed   By: Roanna RaiderJeffery  Chang M.D.   On: 03/19/2017 21:37    Cardiac Studies   Elevated cardiac enzymes  Patient Profile     67 y.o. male with NSTEMI  Assessment & Plan    1) NSTEMI:  THe risks and benefits of cath were explained to the patient.  He is agreeable.  COntinue IV heparin.    Cardiac catheterization was discussed with the patient fully. The patient understands that risks include  but are not limited to stroke (1 in 1000), death (1 in 1000), kidney failure [usually temporary] (1 in 500), bleeding (1 in 200), allergic reaction [possibly serious] (1 in 200).  The patient understands and is willing to proceed.    He needs to stop smoking.    LDL 123, will need high dose statin.   For questions or updates, please contact [facility] Please consult [email or web address] for contact info under Cardiology/STEMI.      Signed, Lance Muss, MD  03/20/2017, 8:40 AM

## 2017-03-20 NOTE — Research (Signed)
AEGIS II Informed Consent   Subject Name: Garrett Robertson  Subject met inclusion and exclusion criteria.  The informed consent form, study requirements and expectations were reviewed with the subject and questions and concerns were addressed prior to the signing of the consent form.  The subject verbalized understanding of the trail requirements.  The subject agreed to participate in the AEGIS II trial and signed the informed consent.  The informed consent was obtained prior to performance of any protocol-specific procedures for the subject.  A copy of the signed informed consent was given to the subject and a copy was placed in the subject's medical record.  Hedrick,Venesa Semidey W 03/20/2017, 5:21 PM

## 2017-03-20 NOTE — ED Notes (Signed)
Permit signed for catheterization

## 2017-03-21 ENCOUNTER — Inpatient Hospital Stay (HOSPITAL_COMMUNITY): Payer: Self-pay

## 2017-03-21 DIAGNOSIS — E785 Hyperlipidemia, unspecified: Secondary | ICD-10-CM | POA: Diagnosis present

## 2017-03-21 DIAGNOSIS — Z87891 Personal history of nicotine dependence: Secondary | ICD-10-CM | POA: Diagnosis present

## 2017-03-21 DIAGNOSIS — I214 Non-ST elevation (NSTEMI) myocardial infarction: Secondary | ICD-10-CM

## 2017-03-21 LAB — COMPREHENSIVE METABOLIC PANEL
ALBUMIN: 3.6 g/dL (ref 3.5–5.0)
ALK PHOS: 54 U/L (ref 38–126)
ALT: 45 U/L (ref 17–63)
AST: 123 U/L — AB (ref 15–41)
Anion gap: 9 (ref 5–15)
BILIRUBIN TOTAL: 1.1 mg/dL (ref 0.3–1.2)
BUN: 9 mg/dL (ref 6–20)
CALCIUM: 8.9 mg/dL (ref 8.9–10.3)
CO2: 25 mmol/L (ref 22–32)
CREATININE: 1.19 mg/dL (ref 0.61–1.24)
Chloride: 105 mmol/L (ref 101–111)
GFR calc Af Amer: >60 mL/min (ref >60)
GFR calc non Af Amer: >60 mL/min (ref >60)
GLUCOSE: 107 mg/dL — AB (ref 65–99)
Potassium: 4.4 mmol/L (ref 3.5–5.1)
SODIUM: 139 mmol/L (ref 135–145)
TOTAL PROTEIN: 6.4 g/dL — AB (ref 6.5–8.1)

## 2017-03-21 LAB — CBC
HEMATOCRIT: 42.7 % (ref 39.0–52.0)
HEMOGLOBIN: 14.3 g/dL (ref 13.0–17.0)
MCH: 32.8 pg (ref 26.0–34.0)
MCHC: 33.5 g/dL (ref 30.0–36.0)
MCV: 97.9 fL (ref 78.0–100.0)
Platelets: 199 10*3/uL (ref 150–400)
RBC: 4.36 MIL/uL (ref 4.22–5.81)
RDW: 13.5 % (ref 11.5–15.5)
WBC: 7.3 10*3/uL (ref 4.0–10.5)

## 2017-03-21 LAB — ECHOCARDIOGRAM COMPLETE
Height: 72 in
Weight: 3280 oz

## 2017-03-21 LAB — BILIRUBIN, DIRECT: Bilirubin, Direct: <0.1 mg/dL — ABNORMAL LOW (ref 0.1–0.5)

## 2017-03-21 MED ORDER — ANGIOPLASTY BOOK
Freq: Once | Status: AC
  Administered 2017-03-21: 07:00:00
  Filled 2017-03-21: qty 1

## 2017-03-21 MED ORDER — ACETAMINOPHEN 325 MG PO TABS: 650.0000 mg | ORAL_TABLET | Freq: Four times a day (QID) | ORAL | Status: AC | PRN

## 2017-03-21 MED ORDER — NITROGLYCERIN 0.4 MG SL SUBL: 0.4000 mg | SUBLINGUAL_TABLET | SUBLINGUAL | 2 refills | Status: DC | PRN

## 2017-03-21 MED ORDER — ATORVASTATIN CALCIUM 80 MG PO TABS: 80.0000 mg | ORAL_TABLET | Freq: Every day | ORAL | 3 refills | Status: DC

## 2017-03-21 MED ORDER — METOPROLOL TARTRATE 25 MG PO TABS
25.0000 mg | ORAL_TABLET | Freq: Two times a day (BID) | ORAL | Status: DC
  Administered 2017-03-21: 09:00:00 25 mg via ORAL
  Filled 2017-03-21: qty 1

## 2017-03-21 MED ORDER — TICAGRELOR 90 MG PO TABS: 90.0000 mg | ORAL_TABLET | Freq: Two times a day (BID) | ORAL | 3 refills | Status: DC

## 2017-03-21 MED ORDER — LISINOPRIL 5 MG PO TABS: 5.0000 mg | ORAL_TABLET | Freq: Every day | ORAL | 3 refills | Status: DC

## 2017-03-21 MED ORDER — ASPIRIN 81 MG PO TBEC: 81.0000 mg | DELAYED_RELEASE_TABLET | Freq: Every day | ORAL | Status: DC

## 2017-03-21 MED ORDER — METOPROLOL TARTRATE 25 MG PO TABS: 25.0000 mg | ORAL_TABLET | Freq: Two times a day (BID) | ORAL | 3 refills | Status: DC

## 2017-03-21 NOTE — Progress Notes (Signed)
CARDIAC REHAB PHASE I   PRE:  Rate/Rhythm: 72 SR  BP:  Supine: 131/78 Sitting:   Standing:    SaO2: 97% RA  MODE:  Ambulation: 600 ft   POST:  Rate/Rhythm: 87 SR  BP:  Supine: 138/83 Sitting:   Standing:    SaO2: 99% RA  0810-0900 Patient tolerated ambulation well with assist x1, no symptoms with exertion. MI/PCI education complete including restrictions, risk factor modification and activity progression. MI book, heart healthy diet, tobacco cessation, and exercise handouts given. Pt verbalizes understanding of instructions given. Discussed phase 2 cardiac rehab, and pt is interested. Permission given to send contact info to cardiac rehab program in [place].  Artist Paislinty M Shammond Arave, MS, ACSM CEP

## 2017-03-21 NOTE — Plan of Care (Signed)
  Education: Understanding of CV disease, CV risk reduction, and recovery process will improve 03/21/2017 0428 - Progressing by Angelica Pou, Trilby Drummer, RN   Cardiovascular: Ability to achieve and maintain adequate cardiovascular perfusion will improve 03/21/2017 0428 - Completed/Met by Tish Frederickson, RN Vascular access site(s) Level 0-1 will be maintained 03/21/2017 0428 - Completed/Met by Tish Frederickson, RN   Cardiovascular: Vascular access site(s) Level 0-1 will be maintained 03/21/2017 0428 - Completed/Met by Tish Frederickson, RN

## 2017-03-21 NOTE — Progress Notes (Signed)
Progress Note  Patient Name: Garrett Robertson Date of Encounter: 03/21/2017  Primary Cardiologist: Dr Irving Shows  Subjective   67 yo admitted with NSTEMI Now s/p stenting of mid RCA  Mild LAD disease   No chest pain overnight  Inpatient Medications    Scheduled Meds: . aspirin EC  81 mg Oral Daily  . atorvastatin  80 mg Oral q1800  . lisinopril  5 mg Oral Daily  . metoprolol tartrate  25 mg Oral BID  . nicotine  21 mg Transdermal Daily  . sodium chloride flush  3 mL Intravenous Q12H  . ticagrelor  90 mg Oral BID   Continuous Infusions: . sodium chloride     PRN Meds: sodium chloride, acetaminophen, nitroGLYCERIN, ondansetron (ZOFRAN) IV, sodium chloride flush   Vital Signs    Vitals:   03/20/17 1700 03/20/17 2000 03/21/17 0337 03/21/17 0741  BP: (!) 150/86 (!) 143/80 121/88 120/74  Pulse: 74 83 64 81  Resp: 17 18 16 15   Temp:  98.4 F (36.9 C) 98.3 F (36.8 C) 98.1 F (36.7 C)  TempSrc:  Oral Oral Oral  SpO2: 97% 96% 96% 97%  Weight:      Height:        Intake/Output Summary (Last 24 hours) at 03/21/2017 0814 Last data filed at 03/21/2017 0353 Gross per 24 hour  Intake 1444 ml  Output 4250 ml  Net -2806 ml   Filed Weights   03/19/17 1955  Weight: 205 lb (93 kg)    Telemetry    NSR-3 beat NSVT-HR 88 - Personally Reviewed  ECG    NSR, inferior Qs - Personally Reviewed  Physical Exam   GEN: No acute distress.   Neck: No JVD Cardiac: RRR, no murmurs, rubs, or gallops.  Respiratory: decreased breath sounds, no wheezing GI: Soft, nontender, non-distended  MS: No edema; No deformity. Neuro:  Nonfocal  Psych: Normal affect   Labs    Chemistry Recent Labs  Lab 03/19/17 2007 03/20/17 0359 03/21/17 0339  NA 133* 137 139  K 3.9 3.7 4.4  CL 100* 105 105  CO2 20* 22 25  GLUCOSE 113* 107* 107*  BUN 14 9 9   CREATININE 0.98 0.93 1.19  CALCIUM 8.7* 8.7* 8.9  PROT  --   --  6.4*  ALBUMIN  --   --  3.6  AST  --   --  123*  ALT  --   --  45    ALKPHOS  --   --  54  BILITOT  --   --  1.1  GFRNONAA >60 >60 >60  GFRAA >60 >60 >60  ANIONGAP 13 10 9      Hematology Recent Labs  Lab 03/19/17 2007 03/20/17 0359 03/21/17 0339  WBC 8.7 8.3 7.3  RBC 4.26 4.22 4.36  HGB 14.1 13.8 14.3  HCT 40.9 40.3 42.7  MCV 96.0 95.5 97.9  MCH 33.1 32.7 32.8  MCHC 34.5 34.2 33.5  RDW 13.4 13.2 13.5  PLT 226 209 199    Cardiac EnzymesNo results for input(s): TROPONINI in the last 168 hours.  Recent Labs  Lab 03/19/17 2030  TROPIPOC 2.85*     BNPNo results for input(s): BNP, PROBNP in the last 168 hours.   DDimer No results for input(s): DDIMER in the last 168 hours.   Radiology    Dg Chest Portable 1 View  Result Date: 03/19/2017 CLINICAL DATA:  Acute onset of midsternal chest pain, diaphoresis and shortness of breath. EXAM: PORTABLE CHEST 1  VIEW COMPARISON:  None. FINDINGS: The lungs are well-aerated. Minimal left basilar opacity may reflect atelectasis. There is no evidence of pleural effusion or pneumothorax. The cardiomediastinal silhouette is within normal limits. No acute osseous abnormalities are seen. IMPRESSION: Minimal left basilar opacity may reflect atelectasis. Electronically Signed   By: Roanna RaiderJeffery  Chang M.D.   On: 03/19/2017 21:37    Cardiac Studies   Echo 03/21/17- pending  Patient Profile     67 y.o. 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 Cath showed high grade mRCA disease and he had PCI with DES.  Assessment & Plan    NSTEMI  CAD-s/p mRCA PCI/DES with moderate residual CAD in LAD  Smoker- he is committed to quitting  Dyslipidemia  NSVT- 3 bt, will increase beta blocker  Plan: OK for discharge later today. No work till cleared after OP TOC visit.   For questions or updates, please contact [facility] Please consult [email or web address] for contact info under Cardiology/STEMI.      Signed, Corine ShelterLuke Kilroy, PA-C  03/21/2017, 8:14 AM    Attending Note:   The patient was seen  and examined.  Agree with assessment and plan as noted above.  Changes made to the above note as needed.  Patient seen and independently examined with Corine ShelterLuke Kilroy, PA .   We discussed all aspects of the encounter. I agree with the assessment and plan as stated above.  1.  CAD : doing well s/p PCI . No CP  Smoker ,  He is committed to stopping  Continue aspirin once a day  and Brilinta twice a day for 12 months.  2.   NSVT- on metoprolol    I have spent a total of 40 minutes with patient reviewing hospital  notes , telemetry, EKGs, labs and examining patient as well as establishing an assessment and plan that was discussed with the patient. > 50% of time was spent in direct patient care.  3.  Hyperlipidemia: The patient will be discharged on atorvastatin and also the Aegis II study medication     Vesta MixerPhilip J. Karrin Eisenmenger, Montez HagemanJr., MD, [facility] 03/21/2017, 8:24 AM [address],  [address] Office [phone] Pager [phone]

## 2017-03-21 NOTE — Discharge Summary (Signed)
Discharge Summary    Patient ID: Garrett Robertson,  MRN: [id number], DOB/AGE: 66/09/1950 66 y.o.  Admit date: 03/19/2017 Discharge date: 03/21/2017  Primary Care Provider: Patient, No Pcp Per Primary Cardiologist: Dr Eldridge DaceVaranasi  Discharge Diagnoses    Active Problems:   NSTEMI (non-ST elevated myocardial infarction) (HCC)   Smoker   Dyslipidemia   Allergies Allergies  Allergen Reactions  . Bee Venom     Diagnostic Studies/Procedures    Urgnet Cath/ PCI 03/19/17 Echo 03/21/17 _____________   History of Present Illness     67 y/o smoker admitted 03/19/17 with NSTEMI  Hospital Course         67 y.o. 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 Cath showed high grade mRCA disease and he had PCI with DES. The pt does have residual CAD in his LAD-40%. He tolerated the procedure well. He was enrolled in AEGIS2 at discharge. The pt indicates he is committed to not smoking.   _____________  Discharge Vitals Blood pressure 120/74, pulse 81, temperature 98.1 F (36.7 C), temperature source Oral, resp. rate 15, height 6' (1.829 m), weight 205 lb (93 kg), SpO2 97 %.  Filed Weights   03/19/17 1955  Weight: 205 lb (93 kg)    Labs & Radiologic Studies    CBC Recent Labs    03/20/17 0359 03/21/17 0339  WBC 8.3 7.3  HGB 13.8 14.3  HCT 40.3 42.7  MCV 95.5 97.9  PLT 209 199   Basic Metabolic Panel Recent Labs    40/98/1100/09/28 0359 03/21/17 0339  NA 137 139  K 3.7 4.4  CL 105 105  CO2 22 25  GLUCOSE 107* 107*  BUN 9 9  CREATININE 0.93 1.19  CALCIUM 8.7* 8.9   Liver Function Tests Recent Labs    03/21/17 0339  AST 123*  ALT 45  ALKPHOS 54  BILITOT 1.1  PROT 6.4*  ALBUMIN 3.6   No results for input(s): LIPASE, AMYLASE in the last 72 hours. Cardiac Enzymes No results for input(s): CKTOTAL, CKMB, CKMBINDEX, TROPONINI in the last 72 hours. BNP Invalid input(s): POCBNP D-Dimer No results for input(s): DDIMER in the last 72 hours. Hemoglobin  A1C No results for input(s): HGBA1C in the last 72 hours. Fasting Lipid Panel Recent Labs    03/20/17 0359  CHOL 178  HDL 40*  LDLCALC 123*  TRIG 77  CHOLHDL 4.5   Thyroid Function Tests No results for input(s): TSH, T4TOTAL, T3FREE, THYROIDAB in the last 72 hours.  Invalid input(s): FREET3 _____________  Dg Chest Portable 1 View  Result Date: 03/19/2017 CLINICAL DATA:  Acute onset of midsternal chest pain, diaphoresis and shortness of breath. EXAM: PORTABLE CHEST 1 VIEW COMPARISON:  None. FINDINGS: The lungs are well-aerated. Minimal left basilar opacity may reflect atelectasis. There is no evidence of pleural effusion or pneumothorax. The cardiomediastinal silhouette is within normal limits. No acute osseous abnormalities are seen. IMPRESSION: Minimal left basilar opacity may reflect atelectasis. Electronically Signed   By: Roanna RaiderJeffery  Chang M.D.   On: 03/19/2017 21:37   Disposition   Pt is being discharged home today in good condition.  Follow-up Plans & Appointments    Follow-up Information    [facility] Follow up on 04/07/2017.   Why:  hospital follow up at 9:30  Contact information: [address] [id number] [place] [phone]       Corky CraftsVaranasi, Jayadeep S, MD Follow up.   Specialties:  Cardiology, Radiology, Interventional Cardiology Why:  office will contact you Contact information: [address] [place] [address] [phone]          Discharge Instructions    Amb Referral to Cardiac Rehabilitation   Complete by:  As directed    Referred to cardiac rehab program in [place].   Diagnosis:   NSTEMI Coronary Stents        Discharge Medications   Allergies as of 03/21/2017      Reactions   Bee Venom       Medication List    STOP taking these medications   aspirin-acetaminophen-caffeine 250-250-65 MG tablet Commonly known as:  EXCEDRIN MIGRAINE     TAKE these medications     acetaminophen 325 MG tablet Commonly known as:  TYLENOL Take 2 tablets (650 mg total) by mouth every 6 (six) hours as needed for mild pain or headache.   aspirin 81 MG EC tablet Take 1 tablet (81 mg total) by mouth daily.   atorvastatin 80 MG tablet Commonly known as:  LIPITOR Take 1 tablet (80 mg total) by mouth daily at 6 PM.   lisinopril 5 MG tablet Commonly known as:  PRINIVIL,ZESTRIL Take 1 tablet (5 mg total) by mouth daily.   metoprolol tartrate 25 MG tablet Commonly known as:  LOPRESSOR Take 1 tablet (25 mg total) by mouth 2 (two) times daily.   nitroGLYCERIN 0.4 MG SL tablet Commonly known as:  NITROSTAT Place 1 tablet (0.4 mg total) under the tongue every 5 (five) minutes as needed for chest pain (CP or SOB).   ticagrelor 90 MG Tabs tablet Commonly known as:  BRILINTA Take 1 tablet (90 mg total) by mouth 2 (two) times daily.        Aspirin prescribed at discharge?  Yes High Intensity Statin Prescribed? (Lipitor 40-80mg  or Crestor 20-40mg ): Yes Beta Blocker Prescribed? Yes For EF <40%, was ACEI/ARB Prescribed? yes ADP Receptor Inhibitor Prescribed? (i.e. Plavix etc.-Includes Medically Managed Patients): Yes For EF <40%, Aldosterone Inhibitor Prescribed? No: NA Was EF assessed during THIS hospitalization? Yes Was Cardiac Rehab II ordered? (Included Medically managed Patients): Yes   Outstanding Labs/Studies   Echo result pending  Duration of Discharge Encounter   Greater than 30 minutes including physician time.  Jolene Provost PA 03/21/2017, 9:36 AM  Attending Note:   The patient was seen and examined.  Agree with assessment and plan as noted above.  Changes made to the above note as needed.  Patient seen and independently examined with Corine Shelter, PA .   We discussed all aspects of the encounter. I agree with the assessment and plan as stated above.  1.  CAD :   Patient was admitted with a non-ST segment elevation myocardial infarction.  Heart  catheterization revealed a tight mid right coronary stenosis.  He status post PCI of his mid right coronary artery using a drug-eluting stent.  He will need dual antiplatelet therapy for 1 year.   I have spent a total of 40 minutes with patient reviewing hospital  notes , telemetry, EKGs, labs and examining patient as well as establishing an assessment and plan that was discussed with the patient. > 50% of time was spent in direct patient care.    Vesta Mixer, Montez Hageman., MD, [facility] 03/23/2017, 5:56 PM [address],  [address] Office [phone] Pager [phone]

## 2017-03-21 NOTE — Progress Notes (Signed)
  Echocardiogram 2D Echocardiogram has been performed.  Garrett Robertson 03/21/2017, 7:40 AM

## 2017-03-21 NOTE — Discharge Instructions (Signed)
Heart Attack °A heart attack (myocardial infarction, MI) causes damage to the heart that cannot be fixed. A heart attack often happens when a blood clot or other blockage cuts blood flow to the heart. When this happens, certain areas of the heart begin to die. This causes the pain you feel during a heart attack. °Follow these instructions at home: °· Take medicine as told by your doctor. You may need medicine to: °? Keep your blood from clotting too easily. °? Control your blood pressure. °? Lower your cholesterol. °? Control abnormal heart rhythms. °· Change certain behaviors as told by your doctor. This may include: °? Quitting smoking. °? Being active. °? Eating a heart-healthy diet. Ask your doctor for help with this diet. °? Keeping a healthy weight. °? Keeping your diabetes under control. °? Lessening stress. °? Limiting how much alcohol you drink. °Do not take these medicines unless your doctor says that you can: °· Nonsteroidal anti-inflammatory drugs (NSAIDs). These include: °? Ibuprofen. °? Naproxen. °? Celecoxib. °· Vitamin supplements that have vitamin A, vitamin E, or both. °· Hormone therapy that contains estrogen with or without progestin. ° °Get help right away if: °· You have sudden chest discomfort. °· You have sudden discomfort in your: °? Arms. °? Back. °? Neck. °? Jaw. °· You have shortness of breath at any time. °· You have sudden sweating or clammy skin. °· You feel sick to your stomach (nauseous) or throw up (vomit). °· You suddenly get light-headed or dizzy. °· You feel your heart beating fast or skipping beats. °These symptoms may be an emergency. Do not wait to see if the symptoms will go away. Get medical help right away. Call your local emergency services (911 in the [place] Do not drive yourself to the hospital. °This information is not intended to replace advice given to you by your health care provider. Make sure you discuss any questions you have with your health care  provider. °Document Released: 07/29/2011 Document Revised: 07/05/2015 Document Reviewed: 04/01/2013 °Elsevier Interactive Patient Education © 2017 [employer] °Coronary Angiogram With Stent, Care After °This sheet gives you information about how to care for yourself after your procedure. Your health care provider may also give you more specific instructions. If you have problems or questions, contact your health care provider. °What can I expect after the procedure? °After your procedure, it is common to have: °· Bruising in the area where a small, thin tube (catheter) was inserted. This usually fades within 1-2 weeks. °· Blood collecting in the tissue (hematoma) that may be painful to the touch. It should usually decrease in size and tenderness within 1-2 weeks. ° °Follow these instructions at home: °Insertion area care °· Do not take baths, swim, or use a hot tub until your health care provider approves. °· You may shower 24-48 hours after the procedure or as directed by your health care provider. °· Follow instructions from your health care provider about how to take care of your incision. Make sure you: °? Wash your hands with soap and water before you change your bandage (dressing). If soap and water are not available, use hand sanitizer. °? Change your dressing as told by your health care provider. °? Leave stitches (sutures), skin glue, or adhesive strips in place. These skin closures may need to stay in place for 2 weeks or longer. If adhesive strip edges start to loosen and curl up, you may trim the loose edges. Do not remove adhesive strips completely unless your health   care provider tells you to do that. °· Remove the bandage (dressing) and gently wash the catheter insertion site with plain soap and water. °· Pat the area dry with a clean towel. Do not rub the area, because that may cause bleeding. °· Do not apply powder or lotion to the incision area. °· Check your incision area every day for signs of  infection. Check for: °? More redness, swelling, or pain. °? More fluid or blood. °? Warmth. °? Pus or a bad smell. °Activity °· Do not drive for 24 hours if you were given a medicine to help you relax (sedative). °· Do not lift anything that is heavier than 10 lb (4.5 kg) for 5 days after your procedure or as directed by your health care provider. °· Ask your health care provider when it is okay for you: °? To return to work or school. °? To resume usual physical activities or sports. °? To resume sexual activity. °Eating and drinking °· Eat a heart-healthy diet. This should include plenty of fresh fruits and vegetables. °· Avoid the following types of food: °? Food that is high in salt. °? Canned or highly processed food. °? Food that is high in saturated fat or sugar. °? Fried food. °· Limit alcohol intake to no more than 1 drink a day for non-pregnant women and 2 drinks a day for men. One drink equals 12 oz of beer, 5 oz of wine, or 1½ oz of hard liquor. °Lifestyle °· Do not use any products that contain nicotine or tobacco, such as cigarettes and e-cigarettes. If you need help quitting, ask your health care provider. °· Take steps to manage and control your weight. °· Get regular exercise. °· Manage your blood pressure. °· Manage other health problems, such as diabetes. °General instructions °· Take over-the-counter and prescription medicines only as told by your health care provider. Blood thinners may be prescribed after your procedure to improve blood flow through the stent. °· If you need an MRI after your heart stent has been placed, be sure to tell the health care provider who orders the MRI that you have a heart stent. °· Keep all follow-up visits as directed by your health care provider. This is important. °Contact a health care provider if: °· You have a fever. °· You have chills. °· You have increased bleeding from the catheter insertion area. Hold pressure on the area. °Get help right away if: °· You  develop chest pain or shortness of breath. °· You feel faint or you pass out. °· You have unusual pain at the catheter insertion area. °· You have redness, warmth, or swelling at the catheter insertion area. °· You have drainage (other than a small amount of blood on the dressing) from the catheter insertion area. °· The catheter insertion area is bleeding, and the bleeding does not stop after 30 minutes of holding steady pressure on the area. °· You develop bleeding from any other place, such as from your rectum. There may be bright red blood in your urine or stool, or it may appear as black, tarry stool. °This information is not intended to replace advice given to you by your health care provider. Make sure you discuss any questions you have with your health care provider. °Document Released: 08/16/2004 Document Revised: 10/25/2015 Document Reviewed: 10/25/2015 °Elsevier Interactive Patient Education © 2018 Elsevier Inc. ° °

## 2017-03-21 NOTE — Care Management (Addendum)
MATCH letter given to patient and process explained.  All scripts on $4 [facility] list except SL NTG and Brilinta.  Pt has 30 day free Brilinta card.    Update 1419- received call from pt that scripts were sent to a pharmacy that [facility].  Call made to [facility] in [place] to transfer scripts from [facility] in [place].  Pt aware.

## 2017-03-23 ENCOUNTER — Encounter: Payer: Self-pay | Admitting: *Deleted

## 2017-03-23 ENCOUNTER — Ambulatory Visit (HOSPITAL_COMMUNITY)
Admission: RE | Admit: 2017-03-23 | Discharge: 2017-03-23 | Disposition: A | Discharge status: Home / Self Care | Payer: MEDICAID | Source: Ambulatory Visit | Attending: Internal Medicine | Admitting: Internal Medicine

## 2017-03-23 VITALS — BP 102/66 | HR 60 | Resp 14 | Ht 72.0 in | Wt 205.0 lb

## 2017-03-23 DIAGNOSIS — Z006 Encounter for examination for normal comparison and control in clinical research program: Secondary | ICD-10-CM

## 2017-03-23 MED ORDER — STUDY - AEGIS II STUDY - PLACEBO OR CSL112 (PI-HILTY)
170.0000 mL | INTRAVENOUS | Status: DC
  Administered 2017-03-23: 170 mL via INTRAVENOUS
  Filled 2017-03-23: qty 170

## 2017-03-23 MED FILL — Lidocaine HCl Local Inj 1%: INTRAMUSCULAR | Qty: 20 | Status: AC

## 2017-03-23 MED FILL — Heparin Sodium (Porcine) 2 Unit/ML in Sodium Chloride 0.9%: INTRAMUSCULAR | Qty: 1000 | Status: AC

## 2017-03-23 NOTE — Progress Notes (Signed)
Visit 2  Infusion 1:   Current Outpatient Medications:  .  acetaminophen (TYLENOL) 325 MG tablet, Take 2 tablets (650 mg total) by mouth every 6 (six) hours as needed for mild pain or headache., Disp: , Rfl:  .  aspirin EC 81 MG EC tablet, Take 1 tablet (81 mg total) by mouth daily., Disp: , Rfl:  .  atorvastatin (LIPITOR) 80 MG tablet, Take 1 tablet (80 mg total) by mouth daily at 6 PM., Disp: 90 tablet, Rfl: 3 .  lisinopril (PRINIVIL,ZESTRIL) 5 MG tablet, Take 1 tablet (5 mg total) by mouth daily., Disp: 90 tablet, Rfl: 3 .  metoprolol tartrate (LOPRESSOR) 25 MG tablet, Take 1 tablet (25 mg total) by mouth 2 (two) times daily., Disp: 180 tablet, Rfl: 3 .  nitroGLYCERIN (NITROSTAT) 0.4 MG SL tablet, Place 1 tablet (0.4 mg total) under the tongue every 5 (five) minutes as needed for chest pain (CP or SOB)., Disp: 25 tablet, Rfl: 2 .  ticagrelor (BRILINTA) 90 MG TABS tablet, Take 1 tablet (90 mg total) by mouth 2 (two) times daily., Disp: 180 tablet, Rfl: 3  Central Labs drawn today:                                        "CONSENT"   YES     NO   Continuing further Investigational Product and study visits for follow-up?      x     Continuing consent from future biomedical research     x                                      "EVENTS"    YES     NO  AE   (IF YES SEE SOURCE)       X  SAE  (IF YES SEE SOURCE)       X  ENDPOINT   (IF YES SEE SOURCE)       X  REVASCULARIZATION  (IF YES SEE SOURCE)       X  AMPUTATION   (IF YES SEE SOURCE)       x  TROPONIN'S  (IF YES SEE SOURCE)       X   Pt doing well. No complaints of cp or sob. He has quit smoking since last Thursday. Infusion went well, no interruptions.

## 2017-03-24 NOTE — Research (Signed)
Screening visit 1 for AEGIS pt. Inclusion and Exclusion:  Inclusions:   Y N       [x]       []   Male or male at least 66 years of age      [x]       []   Evidence of type I (spontaneous) MI as defined by the following:      [x]       []   a. Detection of a rise and/or fall in Troponin I or T with at least 1 value about the 99% upper reference limit.      []       []    (AND)---  Any 1 or more of the following:       [x]       []   - symptoms of ischemia (ie, resulting from a primary coronary    artery event)      []       []   - New or presumably new significant ST/T wave changes or left bundle branch block.      []       []        - Development of pathological Q waves on EKG      []        []   - Imaging evidence of new loss or viable myocardium or regional wall motion abnormality.      []       []   - ID of intracoronary thrombus by angiography.      [x]       []   No suspicion of acute kidney injury at least 12 hours after angiography OR after first medical contract for subject's not undergoing angiography There must be documented evidence of stable renal function defined as no more than an increase in Serum Creatinine < 0.33m/dl from pre-contrast serum creatinine value.  (Before __0.93___   12 hrs after _1.19____)      [x]       []   Evidence of multi-vessel coronary artery disease defined as:      [x]       []   A. At least 50% stenosis on >1 epicardial artery or left main artery on catherization performed during the index hospitalization.      []       []   B. Prior cardiac catherization with at least 50% stenosis on >1 epicardial artery or left main artery.      []       []   C. Prior PCI and evidence of at least 50% stenosis of at least 1 epicardial artery different from prior revascularized artery.      []       []   D. Prior multivessel coronary artery bypass grafting.      [x]       []   At least 1 of the following established risk factors:      [x]        []   o Age ? 640years      []       []   o Prior history of MI      []       []   o On pharmacological treatment for diabetes mellitus      []       []   o Peripheral arterial disease defined as meeting at least  1 of the following criteria:      []       []           +   Current intermittent claudication or resting limb ischemia and ABI    0.90      []       []           +   History of peripheral revascularization (surgical or percutaneous)      []       []           +  History of limb amputation due to PAD      []       []           +  Angiographic evidence (using computed tomographic angiography, MRA, or invasive angiography or a peripheral artery stenosis ?50%.      []       []   Male subjects must be post-meopausal or with a negative urine                                      pregnancy test prior to randomization. If the urine test cannot be confirmed as negative, a serum pregnancy test will be required. Pregnancy test must be negative.       [x]       []   Investigator believes that the subject is willing and able to adhere to all protocol requirements.        [x]       []   Willing to not participate in another investigational study until completion of their final study visit.     Exclusions:  Y N       []        [x]   If these are the reason for MI (pt is excluded)      []       [x]   1. Myocardial necrosis due mismatch between myocardial oxygen demand and supply, usually due to fixed coronary disease with increased demand leading to MI      []       [x]   2. Cardiac death due to MI      []       [x]   3. Myocardial necrosis due to complications from a PCI      []       [x]   4. Myocardial necrosis due to stent thrombosis      []       [x]   5. Myocardial necrosis due to in stent restenosis as the only etiology      []       [x]   6. Myocardial necrosis in the stenting of coronary artery bypass grafting      []       [x]   Ongoing  hemodynamic instability      []       [x]        +  History of NYHA Class III or IV heart failure within the last year      []       [x]        +  Killip Class III or IV heart failure      []       [x]        +  Sustained  and/or symptomatic hypotension (SBP <90 mm HG)      []       [x]        +  Known left ventricular ejection fraction of <30%      []       [x]   Evidence of hepatobiliary disease as indicated by any 1 or more of the   following at screening:      []       [x]        +  Current active hepatic dysfunction or active biliary obstruction      []       [x]        +  Chronic or prior history of cirrhosis or of infectious / inflammatory hepatitis      []       [x]        + Hepatic lab abnormalities: ALT > 3 x ULN or Total bilirubin > 2x ULN at randomization.      []       [x]   Severe chronic kidney disease (eGFR of <47m) or on dialysis      []       [x]   Plan to undergo scheduled coronary artery bypass graft surgery after randomization, as determined at the time of screening      []       [x]   Known history of allergies to soybeans, peanuts, albumin      []       [x]   A known history of IgA deficiency or antibodies to IgA      []       [x]   A comorbid condition with an estimated life expectancy of ? 6 months at time of consent      []       [x]   Women who are pregnant or breastfeeding at time of randomization      []       [x]   Participated in another interventional clinical study at the time of consent      []       [x]   Treatment with anticancer therapy      []       [x]   Previously randomized or participated in this study or previously exposed to [place]  Newer results are available. Click to view them now.   Ref Range & Units 4d ago  Sodium 135 - 145 mmol/L 137   Potassium 3.5 - 5.1 mmol/L 3.7   Chloride 101 - 111 mmol/L 105   CO2 22 - 32 mmol/L 22   Glucose, Bld 65 - 99 mg/dL 107 Abnormally high    BUN 6 - 20  mg/dL 9   Creatinine, Ser 0.61 - 1.24 mg/dL 0.93   Calcium 8.9 - 10.3 mg/dL 8.7 Abnormally low    GFR calc non Af Amer >60 mL/min >60   GFR calc Af Amer >60 mL/min >60    Post Contrast Labs:   Ref Range & Units 3d ago  Sodium 135 - 145 mmol/L 139   Potassium 3.5 - 5.1 mmol/L 4.4   Chloride 101 - 111 mmol/L 105   CO2 22 - 32 mmol/L 25   Glucose, Bld 65 - 99 mg/dL 107 Abnormally high    BUN 6 - 20 mg/dL 9   Creatinine, Ser 0.61 - 1.24 mg/dL 1.19   Calcium 8.9 - 10.3 mg/dL 8.9  Total Protein 6.5 - 8.1 g/dL 6.4 Abnormally low    Albumin 3.5 - 5.0 g/dL 3.6   AST 15 - 41 U/L 123 Abnormally high    ALT 17 - 63 U/L 45   Alkaline Phosphatase 38 - 126 U/L 54   Total Bilirubin 0.3 - 1.2 mg/dL 1.1   GFR calc non Af Amer >60 mL/min >60   GFR calc Af Amer >60 mL/min >60     Ref Range & Units 3d ago  Bilirubin, Direct 0.1 - 0.5 mg/dL <0.1 Abnormally low    Health Questionnaire  English version for the [place]    By placing a checkmark in one box in each group below, please indicate which statements best describe your own health state today.     Mobility   I have no problems in walking about X  I have some problems in walking about ?  I am confined to bed ?     Self-Care   I have no problems with self-care X  I have some problems washing or dressing myself ?  I am unable to wash or dress myself ?     Usual Activities (e.g. work, study, housework, family or leisure activities)   I have no problems with performing my usual activities X  I have some problems with performing my usual activities ?  I am unable to perform my usual activities ?     Pain / Discomfort   I have no pain or discomfort X  I have moderate pain or discomfort ?  I have extreme pain or discomfort ?     Anxiety / Depression   I am not anxious or depressed X  I am moderately anxious or depressed ?  I am extremely anxious or depressed ?   To help people say how good or bad a health state is, we have drawn a  scale (rather like a thermometer) on which the best state you can imagine is marked 100 and the worst state you can imagine is marked 0.    We would like you to indicate on this scale how good or bad your own health is today, in your opinion. Please do this by drawing a line from the box below to whichever point on the scale indicates how good or bad your health state is today.   90    DEMOGRAPHICS:  Patient Name: Garrett Robertson Birth Date: [birth year]  Sex: Male  Race: white  Child Bearing: ? Yes    ? No ? Tubial ligation ? Hysterectomy  ? postmenopausal   Height: 182 cm Weight: 93 kg   Index Procedure:  Onset date of symptoms: 7-Feb-19 Onset of symptoms: 1400  Date of First contact at hospital: 7-Feb-19 Time of first contact at hospital: 1952  Admission Date: 7-Feb-19   Discharge Date: 9-Feb-19 Discharge Time: 1035   Vital Signs: Date 03/21/2017  @ 0337 BP: 121/88  Pulse: 64   Concomitant medications: Every visit: ? See med sheet  BMP Pre Contrast IV 0.93  CMP Post Contrast IV 12 hours later: 1.19 Hepatic Panel:  ALT: 45 Total Bili: 1.1 Direct Bili: 0.1   Medical History:  ? CAD ? Prior MI ? PAD  ? History of Heart Failure ? Moderate to severe valvular dx ? AFib  ? Prior Coronary Revascularization  if YES please select Yes or No below:  CABG ? Yes   ? No           PCI with  stent ? Yes   ? No          PCI without stent ? Yes ? No  ? CVA if checked please select one of the following Choose an item.  ? Hypertension  ? Gilberts syndrome ? CKD   ? Hypocholesteremia ? DM ? Smoker        ? eCigarette  Killip Class Stage 1     EQ-5D-3L ?  Future Biomedical Research: Consented ? Yes     ? No If yes please date they withdrew consent from biomedical research Click or tap to enter a date.

## 2017-03-25 ENCOUNTER — Telehealth: Payer: Self-pay | Admitting: Interventional Cardiology

## 2017-03-25 NOTE — Telephone Encounter (Signed)
Patient calling,  Patient had stents installed and would like to know if he is able to drive now.

## 2017-03-25 NOTE — Telephone Encounter (Signed)
Returned call to patient and made him aware that his driving restriction was only for 24 hours after to allow time for his sedation to wear off. Patient verbalized understanding and thanked me for the call.

## 2017-03-30 ENCOUNTER — Encounter (HOSPITAL_COMMUNITY): Payer: Self-pay

## 2017-03-30 ENCOUNTER — Encounter (HOSPITAL_COMMUNITY)
Admission: RE | Admit: 2017-03-30 | Discharge: 2017-03-30 | Disposition: A | Discharge status: Home / Self Care | Payer: Self-pay | Source: Ambulatory Visit | Attending: Internal Medicine | Admitting: Internal Medicine

## 2017-03-30 ENCOUNTER — Encounter: Payer: Self-pay | Admitting: *Deleted

## 2017-03-30 VITALS — BP 132/78 | HR 70

## 2017-03-30 DIAGNOSIS — Z006 Encounter for examination for normal comparison and control in clinical research program: Secondary | ICD-10-CM

## 2017-03-30 LAB — BASIC METABOLIC PANEL
Anion gap: 9 (ref 5–15)
BUN: 17 mg/dL (ref 6–20)
CHLORIDE: 104 mmol/L (ref 101–111)
CO2: 24 mmol/L (ref 22–32)
CREATININE: 1.09 mg/dL (ref 0.61–1.24)
Calcium: 9.2 mg/dL (ref 8.9–10.3)
GFR calc non Af Amer: >60 mL/min (ref >60)
Glucose, Bld: 99 mg/dL (ref 65–99)
Potassium: 4.7 mmol/L (ref 3.5–5.1)
Sodium: 137 mmol/L (ref 135–145)

## 2017-03-30 MED ORDER — STUDY - AEGIS II STUDY - PLACEBO OR CSL112 (PI-HILTY)
170.0000 mL | INTRAVENOUS | Status: DC
  Administered 2017-03-30: 12:00:00 170 mL via INTRAVENOUS
  Filled 2017-03-30: qty 170

## 2017-03-30 NOTE — Progress Notes (Signed)
VISIT 3 INFUSION 2                                   "CONSENT"   YES     NO   Continuing further Investigational Product and study visits for follow-up?     X     Continuing consent from future biomedical research     X                                      "EVENTS"    YES     NO  AE   (IF YES SEE SOURCE)       X  SAE  (IF YES SEE SOURCE)       X  ENDPOINT   (IF YES SEE SOURCE)       X  REVASCULARIZATION  (IF YES SEE SOURCE)       X  AMPUTATION   (IF YES SEE SOURCE)       X  TROPONIN'S  (IF YES SEE SOURCE)       X    Medications reviewed.   Current Outpatient Medications:  .  acetaminophen (TYLENOL) 325 MG tablet, Take 2 tablets (650 mg total) by mouth every 6 (six) hours as needed for mild pain or headache., Disp: , Rfl:  .  aspirin EC 81 MG EC tablet, Take 1 tablet (81 mg total) by mouth daily., Disp: , Rfl:  .  atorvastatin (LIPITOR) 80 MG tablet, Take 1 tablet (80 mg total) by mouth daily at 6 PM., Disp: 90 tablet, Rfl: 3 .  lisinopril (PRINIVIL,ZESTRIL) 5 MG tablet, Take 1 tablet (5 mg total) by mouth daily., Disp: 90 tablet, Rfl: 3 .  metoprolol tartrate (LOPRESSOR) 25 MG tablet, Take 1 tablet (25 mg total) by mouth 2 (two) times daily., Disp: 180 tablet, Rfl: 3 .  nitroGLYCERIN (NITROSTAT) 0.4 MG SL tablet, Place 1 tablet (0.4 mg total) under the tongue every 5 (five) minutes as needed for chest pain (CP or SOB)., Disp: 25 tablet, Rfl: 2 .  ticagrelor (BRILINTA) 90 MG TABS tablet, Take 1 tablet (90 mg total) by mouth 2 (two) times daily., Disp: 180 tablet, Rfl: 3 No current facility-administered medications for this visit.   Facility-Administered Medications Ordered in Other Visits:  .  AEGIS II Study - Placebo / CSL112 (PI-Hilty), 170 mL, Intravenous, Q7 days, Hilty, Lisette AbuKenneth C, MD, 170 mL at 03/30/17 1203  Local labs drawn and reviewed by [facility] Central labs drawn:

## 2017-03-30 NOTE — Progress Notes (Addendum)
Cardiology Office Note:    Date:  03/31/2017   ID:  Garrett Robertson, DOB 12-29-1950, MRN [id number]  PCP:  Patient, No Pcp Per  Cardiologist:  Lance Muss, MD   Referring MD: No ref. provider found   Chief Complaint  Patient presents with  . Hospitalization Follow-up    nstemi    History of Present Illness:    Garrett Robertson is a 67 y.o. male with a hx of HLD, current smoker, and NSTEMI 03/19/17. He presented to [facility] with acute onset of chest pain. His troponin was 2.85 and chest pain was relieved with nitro SL x 1. He was taken to the cath lab which showed high grade mRCA disease treated with DES. He had 40% stenosis in the LAD, no intervention. He tolerated the procedure well and was discharged 03/21/17 on ASA and brilinta, lopressor, lisinopril, and 80 mg lipitor. 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    He presents today for hospital follow up. Since his heart cath, he has done well. He denies chest pain, shortness of breath, lower extremity swelling, dizziness, syncope, and palpitations. He reports no bleeding problems. He quit smoking. He wants to return to work. We discussed his restrictions, motorcycle riding, and alcohol use. He is committed to a low salt diet.  Given brilinta's cost, he will switch to plavix after his first 30 days of brilinta (as below).    Past Medical History:  Diagnosis Date  . Dyslipidemia, goal LDL below 70   . Essential hypertension   . Former smoker   . NSTEMI (non-ST elevated myocardial infarction) (HCC) 03/19/2017   DES to RCA  . Sleep apnea    "tested; had CPAP; lost ~ 40#; don't use CPAP anymore" (03/20/2017)    Past Surgical History:  Procedure Laterality Date  . CORONARY ANGIOPLASTY WITH STENT PLACEMENT  03/20/2017  . CORONARY STENT INTERVENTION N/A 03/20/2017   Procedure: CORONARY STENT INTERVENTION;  Surgeon: Swaziland, Peter M, MD;  Location: [facility] INVASIVE CV LAB;  Service:  Cardiovascular;  Laterality: N/A;  . LEFT HEART CATH AND CORONARY ANGIOGRAPHY N/A 03/20/2017   Procedure: LEFT HEART CATH AND CORONARY ANGIOGRAPHY;  Surgeon: Swaziland, Peter M, MD;  Location: [facility] INVASIVE CV LAB;  Service: Cardiovascular;  Laterality: N/A;    Current Medications: Current Meds  Medication Sig  . acetaminophen (TYLENOL) 325 MG tablet Take 2 tablets (650 mg total) by mouth every 6 (six) hours as needed for mild pain or headache.  Marland Kitchen aspirin EC 81 MG EC tablet Take 1 tablet (81 mg total) by mouth daily.  Marland Kitchen atorvastatin (LIPITOR) 80 MG tablet Take 1 tablet (80 mg total) by mouth daily at 6 PM.  . lisinopril (PRINIVIL,ZESTRIL) 5 MG tablet Take 1 tablet (5 mg total) by mouth daily.  . metoprolol tartrate (LOPRESSOR) 25 MG tablet Take 1 tablet (25 mg total) by mouth 2 (two) times daily.  . nitroGLYCERIN (NITROSTAT) 0.4 MG SL tablet Place 1 tablet (0.4 mg total) under the tongue every 5 (five) minutes as needed for chest pain (CP or SOB).  . [DISCONTINUED] ticagrelor (BRILINTA) 90 MG TABS tablet Take 1 tablet (90 mg total) by mouth 2 (two) times daily.     Allergies:   Bee venom   Social History   Socioeconomic History  . Marital status: Divorced    Spouse name: None  . Number of children: None  . Years of education: None  . Highest education level:  None  Social Needs  . Financial resource strain: None  . Food insecurity - worry: None  . Food insecurity - inability: None  . Transportation needs - medical: None  . Transportation needs - non-medical: None  Occupational History  . None  Tobacco Use  . Smoking status: Former Smoker    Packs/day: 1.50    Years: 48.00    Pack years: 72.00    Types: Cigarettes    Last attempt to quit: 03/19/2017    Years since quitting: 0.0  . Smokeless tobacco: Never Used  Substance and Sexual Activity  . Alcohol use: Yes    Alcohol/week: 28.8 oz    Types: 48 Cans of beer per week    Comment: 03/20/2017 "couple cases/wk"  . Drug use: No  .  Sexual activity: Not Currently  Other Topics Concern  . None  Social History Narrative  . None     Family History: The patient's father - HTN  ROS:   Please see the history of present illness.    All other systems reviewed and are negative.  EKGs/Labs/Other Studies Reviewed:    The following studies were reviewed today:   Left heart cath 03/20/17  Mid RCA lesion is 95% stenosed.  A drug-eluting stent was successfully placed using a STENT SYNERGY DES 3X16.  Post intervention, there is a 0% residual stenosis.  Prox LAD lesion is 40% stenosed.  Prox Cx to Mid Cx lesion is 55% stenosed.  The left ventricular systolic function is normal.  LV end diastolic pressure is normal.  The left ventricular ejection fraction is 55-65% by visual estimate.   1. 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 Normal LV function 3. Normal LVEDP 4. Successful stenting of the mid RCA with DES.  Plan: The patient did have some ST elevation in the inferior leads post PCI. He had only 1/10 chest pain. This was felt to be related to occlusion of a second RV marginal branch. DAPT for one year. Risk factor modification. Anticipate DC in am if stable.    Echo 03/21/17: Study Conclusions - Left ventricle: Systolic function was normal. The estimated   ejection fraction was in the range of 55% to 60%. Left   ventricular diastolic function parameters were normal. - Right atrium: some shadowing artifact in sub costal images - Atrial septum: There was increased thickness of the septum,   consistent with lipomatous hypertrophy. No defect or patent   foramen ovale was identified.   EKG:  EKG is  ordered today.  The ekg ordered today demonstrates q waves inferiorly with inverted T waves, similar to prior  Recent Labs: 03/21/2017: ALT 45; Hemoglobin 14.3; Platelets 199 03/30/2017: BUN 17; Creatinine, Ser 1.09; Potassium 4.7; Sodium 137  Recent Lipid Panel    Component Value Date/Time   CHOL  178 03/20/2017 0359   TRIG 77 03/20/2017 0359   HDL 40 (L) 03/20/2017 0359   CHOLHDL 4.5 03/20/2017 0359   VLDL 15 03/20/2017 0359   LDLCALC 123 (H) 03/20/2017 0359    Physical Exam:    VS:  BP 130/74   Pulse 83   Ht 6' (1.829 m)   Wt 201 lb 12 oz (91.5 kg)   SpO2 93%   BMI 27.36 kg/m     Wt Readings from Last 3 Encounters:  03/31/17 201 lb 12 oz (91.5 kg)  03/30/17 201 lb 8 oz (91.4 kg)  03/23/17 205 lb (93 kg)     GEN:  Well nourished, well  developed in no acute distress HEENT: Normal NECK: No JVD; No carotid bruits LYMPHATICS: No lymphadenopathy CARDIAC: RRR, no murmurs, rubs, gallops RESPIRATORY:  Clear to auscultation without rales, wheezing or rhonchi  ABDOMEN: Soft, non-tender, non-distended MUSCULOSKELETAL:  No edema; No deformity  SKIN: Warm and dry, right radial cath site C/D/I without bleeding or hematoma NEUROLOGIC:  Alert and oriented x 3 PSYCHIATRIC:  Normal affect   ASSESSMENT:    1. NSTEMI (non-ST elevated myocardial infarction) (HCC)   2. Essential hypertension   3. Dyslipidemia   4. Former smoker    PLAN:    In order of problems listed above:  1. CAD, s/p NSTEMI with DES to mid RCA, residual stenosis of 40% in the LAD He is on ASA and brilinta. He is self-pay and future brilinta is cost-prohibitive. In consultation with Dr. Eldridge Dace, he will finish his 30 days of brilinta and then switch to plavix (load with 300 mg on the first day, then 75 mg daily thereafter). Continue lopressor 25 mg BID. He will follow up with Dr. Eldridge Dace in 6 months, or sooner if needed.   2. HTN Pressure today 130/74.  Continue lisinopril. Encouraged him to check his pressures at home. Encouraged low salt diet.  3. HLD 03/20/2017: Cholesterol 178; HDL 40; LDL Cholesterol 123; Triglycerides 77; VLDL 15 Continue 80 mg lipitor. Plan to check lipids and LFTs in 4-5 weeks.  4. Former smoker He has stopped smoking. Congratulated him on his cessation. He is doing well.      Medication Adjustments/Labs and Tests Ordered: Current medicines are reviewed at length with the patient today.  Concerns regarding medicines are outlined above.  Orders Placed This Encounter  Procedures  . Lipid panel  . Hepatic function panel  . EKG 12-Lead   Meds ordered this encounter  Medications  . clopidogrel (PLAVIX) 75 MG tablet    Sig: TAKE 4 TABLETS BY MOUTH ON DAY 1, TAKE 1 TABLET BY MOUTH DAILY    Dispense:  34 tablet    Refill:  3    Signed, Marcelino Duster, [place]  03/31/2017 2:02 PM    [facility]

## 2017-03-31 ENCOUNTER — Encounter: Payer: Self-pay | Admitting: Physician Assistant

## 2017-03-31 ENCOUNTER — Ambulatory Visit (INDEPENDENT_AMBULATORY_CARE_PROVIDER_SITE_OTHER): Payer: Self-pay | Admitting: Physician Assistant

## 2017-03-31 VITALS — BP 130/74 | HR 83 | Ht 72.0 in | Wt 201.8 lb

## 2017-03-31 DIAGNOSIS — E785 Hyperlipidemia, unspecified: Secondary | ICD-10-CM

## 2017-03-31 DIAGNOSIS — Z87891 Personal history of nicotine dependence: Secondary | ICD-10-CM

## 2017-03-31 DIAGNOSIS — I214 Non-ST elevation (NSTEMI) myocardial infarction: Secondary | ICD-10-CM

## 2017-03-31 DIAGNOSIS — I10 Essential (primary) hypertension: Secondary | ICD-10-CM | POA: Insufficient documentation

## 2017-03-31 MED ORDER — CLOPIDOGREL BISULFATE 75 MG PO TABS: ORAL_TABLET | ORAL | 3 refills | Status: DC

## 2017-03-31 NOTE — Patient Instructions (Addendum)
Medication Instructions:  Your physician has recommended you make the following change in your medication:  1.  FINISH YOUR BRILINTA 2.  START Plavix the next day at 75 mg taking 4 tablets at once on the 1st day then on the 2nd day you will start with 1 tablet daily   Labwork: 4 WEEKS:  FASTING LIPID & LFT  Testing/Procedures: None ordered  Follow-Up: Your physician wants you to follow-up in: 6 MONTHS WITH DR. VARANASI   You will receive a reminder letter in the mail two months in advance. If you don't receive a letter, please call our office to schedule the follow-up appointment. Any Other Special Instructions Will Be Listed Below (If Applicable).     If you need a refill on your cardiac medications before your next appointment, please call your pharmacy.

## 2017-04-01 NOTE — Progress Notes (Signed)
As the principal investigator for [facility], I have reviewed the labs and have noted the following discrepancies: normal.  No SAE's or AE's noted. Continue with infusion protocol.  Chrystie NoseKenneth C. Shyan Scalisi, MD, [facility] ClincsFACC, FACP  [place]  [facility]  Medical Director of the Advanced Lipid Disorders &  Cardiovascular Risk Reduction Clinic Diplomate of the American Board of Clinical Lipidology Attending Cardiologist  Direct Dial: [phone]  Fax: [phone]  Website:  [email or web address]

## 2017-04-06 ENCOUNTER — Encounter: Payer: Self-pay | Admitting: *Deleted

## 2017-04-06 ENCOUNTER — Encounter (HOSPITAL_COMMUNITY)
Admission: RE | Admit: 2017-04-06 | Discharge: 2017-04-06 | Disposition: A | Discharge status: Home / Self Care | Payer: Self-pay | Source: Ambulatory Visit | Attending: Internal Medicine | Admitting: Internal Medicine

## 2017-04-06 VITALS — BP 104/64 | HR 68

## 2017-04-06 DIAGNOSIS — Z006 Encounter for examination for normal comparison and control in clinical research program: Secondary | ICD-10-CM

## 2017-04-06 MED ORDER — STUDY - AEGIS II STUDY - PLACEBO OR CSL112 (PI-HILTY)
170.0000 mL | INTRAVENOUS | Status: DC
  Administered 2017-04-06: 10:00:00 170 mL via INTRAVENOUS
  Filled 2017-04-06: qty 170

## 2017-04-06 NOTE — Progress Notes (Signed)
Visit 4 infusion 3   Pt doing well. No complaints of chest pains. He saw his cardiologist the 2119 th of February. At the visit, they told him to finish the 30 days of Brilinta and then start Plavix.                                   "CONSENT"   YES     NO   Continuing further Investigational Product and study visits for follow-up?     X     Continuing consent from future biomedical research      X                                      "EVENTS"    YES     NO  AE   (IF YES SEE SOURCE)      X  SAE  (IF YES SEE SOURCE)     X  ENDPOINT   (IF YES SEE SOURCE)      X  REVASCULARIZATION  (IF YES SEE SOURCE)      X  AMPUTATION   (IF YES SEE SOURCE)      X  TROPONIN'S  (IF YES SEE SOURCE)      X   Central labs drawn today:

## 2017-04-07 ENCOUNTER — Ambulatory Visit: Payer: Self-pay | Admitting: Family Medicine

## 2017-04-13 ENCOUNTER — Ambulatory Visit (HOSPITAL_COMMUNITY)
Admission: RE | Admit: 2017-04-13 | Discharge: 2017-04-13 | Disposition: A | Discharge status: Home / Self Care | Payer: MEDICAID | Source: Ambulatory Visit | Attending: Internal Medicine | Admitting: Internal Medicine

## 2017-04-13 ENCOUNTER — Encounter: Payer: Self-pay | Admitting: *Deleted

## 2017-04-13 VITALS — BP 111/54 | HR 67

## 2017-04-13 DIAGNOSIS — Z006 Encounter for examination for normal comparison and control in clinical research program: Secondary | ICD-10-CM

## 2017-04-13 MED ORDER — STUDY - AEGIS II STUDY - PLACEBO OR CSL112 (PI-HILTY)
170.0000 mL | INTRAVENOUS | Status: DC
  Administered 2017-04-13: 170 mL via INTRAVENOUS
  Filled 2017-04-13: qty 170

## 2017-04-13 NOTE — Progress Notes (Signed)
Visit 5 Infusion 4  Pt doing well, no complaints of sob or chest pains. Pt has has remained smoke free, I'm very proud of him.   Current Meds  Medication Sig  . acetaminophen (TYLENOL) 325 MG tablet Take 2 tablets (650 mg total) by mouth every 6 (six) hours as needed for mild pain or headache.  Marland Kitchen. aspirin EC 81 MG EC tablet Take 1 tablet (81 mg total) by mouth daily.  Marland Kitchen. atorvastatin (LIPITOR) 80 MG tablet Take 1 tablet (80 mg total) by mouth daily at 6 PM.  . clopidogrel (PLAVIX) 75 MG tablet TAKE 4 TABLETS BY MOUTH ON DAY 1, TAKE 1 TABLET BY MOUTH DAILY  . lisinopril (PRINIVIL,ZESTRIL) 5 MG tablet Take 1 tablet (5 mg total) by mouth daily.  . metoprolol tartrate (LOPRESSOR) 25 MG tablet Take 1 tablet (25 mg total) by mouth 2 (two) times daily.  . nitroGLYCERIN (NITROSTAT) 0.4 MG SL tablet Place 1 tablet (0.4 mg total) under the tongue every 5 (five) minutes as needed for chest pain (CP or SOB).                                     "CONSENT"   YES     NO   Continuing further Investigational Product and study visits for follow-up?     X     Continuing consent from future biomedical research     X                                      "EVENTS"    YES     NO  AE   (IF YES SEE SOURCE)      X  SAE  (IF YES SEE SOURCE)      X  ENDPOINT   (IF YES SEE SOURCE)      X  REVASCULARIZATION  (IF YES SEE SOURCE)      X  AMPUTATION   (IF YES SEE SOURCE)      X  TROPONIN'S  (IF YES SEE SOURCE)       X   Central PK only lab drew post infusion today.

## 2017-04-21 ENCOUNTER — Encounter: Payer: Self-pay | Admitting: *Deleted

## 2017-04-21 VITALS — BP 135/72 | HR 72 | Resp 18 | Wt 207.0 lb

## 2017-04-21 DIAGNOSIS — Z006 Encounter for examination for normal comparison and control in clinical research program: Secondary | ICD-10-CM

## 2017-04-21 MED ORDER — LISINOPRIL 5 MG PO TABS: 5.0000 mg | ORAL_TABLET | Freq: Every day | ORAL | 3 refills | Status: DC

## 2017-04-21 MED ORDER — CLOPIDOGREL BISULFATE 75 MG PO TABS: ORAL_TABLET | ORAL | 3 refills | Status: DC

## 2017-04-21 MED ORDER — ATORVASTATIN CALCIUM 40 MG PO TABS: 80.0000 mg | ORAL_TABLET | Freq: Every day | ORAL | 3 refills | Status: DC

## 2017-04-21 MED ORDER — METOPROLOL TARTRATE 25 MG PO TABS: 25.0000 mg | ORAL_TABLET | Freq: Two times a day (BID) | ORAL | 3 refills | Status: DC

## 2017-04-21 NOTE — Research (Signed)
Patient seen and examined.  Protocol discussed with patient and reviewed.  Exam performed.   Lungs Clear Cardiac rhythm regular. No murmurs.  No edema.   [place] [facility]   Garrett Robertson KillStuckey, MD,FACC

## 2017-04-21 NOTE — Progress Notes (Signed)
Visit 6  Patient came in today for visit 6 today. He is doing well. No complaints of cp. He is having some shortness of breath after taking the Brillinta. He will switch today over to Plavix 75 mg with a  loading dose. All medications reordered for him, and sent to [facility] in [place] as to where it is cheaper for him.    Current Outpatient Medications:  .  acetaminophen (TYLENOL) 325 MG tablet, Take 2 tablets (650 mg total) by mouth every 6 (six) hours as needed for mild pain or headache., Disp: , Rfl:  .  aspirin EC 81 MG EC tablet, Take 1 tablet (81 mg total) by mouth daily., Disp: , Rfl:  .  atorvastatin (LIPITOR) 40 MG tablet, Take 2 tablets (80 mg total) by mouth daily at 6 PM., Disp: 180 tablet, Rfl: 3 .  clopidogrel (PLAVIX) 75 MG tablet, TAKE 4 TABLETS BY MOUTH ON DAY 1, TAKE 1 TABLET BY MOUTH DAILY, Disp: 94 tablet, Rfl: 3 .  lisinopril (PRINIVIL,ZESTRIL) 5 MG tablet, Take 1 tablet (5 mg total) by mouth daily., Disp: 90 tablet, Rfl: 3 .  metoprolol tartrate (LOPRESSOR) 25 MG tablet, Take 1 tablet (25 mg total) by mouth 2 (two) times daily., Disp: 180 tablet, Rfl: 3 .  nitroGLYCERIN (NITROSTAT) 0.4 MG SL tablet, Place 1 tablet (0.4 mg total) under the tongue every 5 (five) minutes as needed for chest pain (CP or SOB)., Disp: 25 tablet, Rfl: 2  * Last dose of Brilinta is last night as he will load with Plavix today.                                    "CONSENT"   YES     NO   Continuing further Investigational Product and study visits for follow-up?     X     Continuing consent from future biomedical research     X                                      "EVENTS"    YES     NO  AE   (IF YES SEE SOURCE)      X   SAE  (IF YES SEE SOURCE)             X  ENDPOINT   (IF YES SEE SOURCE)        X  REVASCULARIZATION  (IF YES SEE SOURCE)        X  AMPUTATION   (IF YES SEE SOURCE)        X  TROPONIN'S  (IF YES SEE SOURCE)        X   Central Labs drawn today.

## 2017-04-23 NOTE — Progress Notes (Signed)
Visit 6 central labs       

## 2017-04-28 ENCOUNTER — Other Ambulatory Visit: Payer: Self-pay | Admitting: *Deleted

## 2017-04-28 DIAGNOSIS — I10 Essential (primary) hypertension: Secondary | ICD-10-CM

## 2017-04-28 DIAGNOSIS — E785 Hyperlipidemia, unspecified: Secondary | ICD-10-CM

## 2017-04-28 DIAGNOSIS — I214 Non-ST elevation (NSTEMI) myocardial infarction: Secondary | ICD-10-CM

## 2017-04-28 DIAGNOSIS — Z87891 Personal history of nicotine dependence: Secondary | ICD-10-CM

## 2017-04-28 LAB — HEPATIC FUNCTION PANEL
ALBUMIN: 4.4 g/dL (ref 3.6–4.8)
ALK PHOS: 68 IU/L (ref 39–117)
ALT: 22 IU/L (ref 0–44)
AST: 19 IU/L (ref 0–40)
Bilirubin Total: 0.4 mg/dL (ref 0.0–1.2)
Bilirubin, Direct: 0.15 mg/dL (ref 0.00–0.40)
Total Protein: 6.5 g/dL (ref 6.0–8.5)

## 2017-04-28 LAB — LIPID PANEL
Chol/HDL Ratio: 2.7 ratio (ref 0.0–5.0)
Cholesterol, Total: 120 mg/dL (ref 100–199)
HDL: 44 mg/dL
LDL Calculated: 65 mg/dL (ref 0–99)
Triglycerides: 57 mg/dL (ref 0–149)
VLDL Cholesterol Cal: 11 mg/dL (ref 5–40)

## 2017-05-06 NOTE — Addendum Note (Signed)
Encounter addended by: Claudean Kindseis, Deby Adger G, RN on: 05/06/2017 7:50 AM  Actions taken: Charge Capture section accepted

## 2017-05-06 NOTE — Addendum Note (Signed)
Encounter addended by: Claudean Kindseis, Jodie Leiner G, RN on: 05/06/2017 7:52 AM  Actions taken: Charge Capture section accepted

## 2017-05-11 NOTE — Progress Notes (Signed)
PK drawn only

## 2017-05-19 NOTE — Progress Notes (Signed)
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  No complications.    VSS.  Lungs clear.  Cardiac rhythm regular. No murmurs.   Garrett Mortonhomas D. Riley KillStuckey, MD, [facility] Hospital[facility]ACC, [facility]SC[facility]

## 2017-05-27 ENCOUNTER — Telehealth: Payer: Self-pay | Admitting: *Deleted

## 2017-05-27 ENCOUNTER — Encounter: Payer: Self-pay | Admitting: *Deleted

## 2017-05-27 DIAGNOSIS — Z006 Encounter for examination for normal comparison and control in clinical research program: Secondary | ICD-10-CM

## 2017-05-27 NOTE — Progress Notes (Signed)
Visit 7  Phone visit  Pt doing well. No complaints of sob and/or chest pain. No change in medications.                                    "CONSENT"   YES     NO   Continuing further Investigational Product and study visits for follow-up? [x]  []   Continuing consent from future biomedical research [x]  []                                    "EVENTS"    YES     NO  AE   (IF YES SEE SOURCE) []  [x]   SAE  (IF YES SEE SOURCE) []  [x]   ENDPOINT   (IF YES SEE SOURCE) []  [x]   REVASCULARIZATION  (IF YES SEE SOURCE) []  [x]   AMPUTATION   (IF YES SEE SOURCE) []  [x]   TROPONIN'S  (IF YES SEE SOURCE) []  [x] 

## 2017-05-27 NOTE — Telephone Encounter (Signed)
Called patient for phone follow up visit 7 see encounter for information.

## 2017-06-01 NOTE — Progress Notes (Signed)
  Current Outpatient Medications:  .  acetaminophen (TYLENOL) 325 MG tablet, Take 2 tablets (650 mg total) by mouth every 6 (six) hours as needed for mild pain or headache., Disp: , Rfl:  .  aspirin EC 81 MG EC tablet, Take 1 tablet (81 mg total) by mouth daily., Disp: , Rfl:  .  atorvastatin (LIPITOR) 40 MG tablet, Take 2 tablets (80 mg total) by mouth daily at 6 PM., Disp: 180 tablet, Rfl: 3 .  clopidogrel (PLAVIX) 75 MG tablet, TAKE 4 TABLETS BY MOUTH ON DAY 1, TAKE 1 TABLET BY MOUTH DAILY, Disp: 94 tablet, Rfl: 3 .  lisinopril (PRINIVIL,ZESTRIL) 5 MG tablet, Take 1 tablet (5 mg total) by mouth daily., Disp: 90 tablet, Rfl: 3 .  metoprolol tartrate (LOPRESSOR) 25 MG tablet, Take 1 tablet (25 mg total) by mouth 2 (two) times daily., Disp: 180 tablet, Rfl: 3 .  nitroGLYCERIN (NITROSTAT) 0.4 MG SL tablet, Place 1 tablet (0.4 mg total) under the tongue every 5 (five) minutes as needed for chest pain (CP or SOB)., Disp: 25 tablet, Rfl: 2

## 2017-06-17 ENCOUNTER — Telehealth: Payer: Self-pay | Admitting: *Deleted

## 2017-06-30 ENCOUNTER — Encounter: Payer: Self-pay | Admitting: *Deleted

## 2017-06-30 VITALS — BP 133/90 | HR 61 | Resp 16 | Wt 209.0 lb

## 2017-06-30 DIAGNOSIS — Z006 Encounter for examination for normal comparison and control in clinical research program: Secondary | ICD-10-CM

## 2017-06-30 NOTE — Progress Notes (Addendum)
AEGIS II research study: Follow up visit 8. ICF Version 2.0 (Local IRB dates 13/feb/2019) signed and copy given to patient. VSS, patient denies any adverse events or other issues. He has not smoked, is somewhat compliant with a heart healthy diet. He does not participate in cardiac rehab but average steps per day is 15,000.  I instructed him the importance of diet, activity and smoking cessation & I thanked him for his participation.  Questions were encouraged and answered. Next research required visit is due between 30/ Jul/2019-19/Aug/2019.  Current Outpatient Medications:  .  acetaminophen (TYLENOL) 325 MG tablet, Take 2 tablets (650 mg total) by mouth every 6 (six) hours as needed for mild pain or headache., Disp: , Rfl:  .  aspirin EC 81 MG EC tablet, Take 1 tablet (81 mg total) by mouth daily., Disp: , Rfl:  .  atorvastatin (LIPITOR) 40 MG tablet, Take 2 tablets (80 mg total) by mouth daily at 6 PM., Disp: 180 tablet, Rfl: 3 .  clopidogrel (PLAVIX) 75 MG tablet, TAKE 4 TABLETS BY MOUTH ON DAY 1, TAKE 1 TABLET BY MOUTH DAILY, Disp: 94 tablet, Rfl: 3 .  lisinopril (PRINIVIL,ZESTRIL) 5 MG tablet, Take 1 tablet (5 mg total) by mouth daily., Disp: 90 tablet, Rfl: 3 .  metoprolol tartrate (LOPRESSOR) 25 MG tablet, Take 1 tablet (25 mg total) by mouth 2 (two) times daily., Disp: 180 tablet, Rfl: 3 .  nitroGLYCERIN (NITROSTAT) 0.4 MG SL tablet, Place 1 tablet (0.4 mg total) under the tongue every 5 (five) minutes as needed for chest pain (CP or SOB)., Disp: 25 tablet, Rfl: 2                                     "CONSENT"   YES     NO   Continuing further Investigational Product and study visits for follow-up?    Continuing consent from future biomedical research                                       "EVENTS"    YES     NO  AE   (IF YES SEE SOURCE)    SAE  (IF YES SEE SOURCE)    ENDPOINT   (IF YES SEE SOURCE)    REVASCULARIZATION  (IF YES SEE SOURCE)    AMPUTATION    (IF YES SEE SOURCE)    TROPONIN'S  (IF YES SEE SOURCE)     Lifestyle Adherence Assessment:    YES NO  Abstinence from smoking/remaining tobacco free X   Cardiac Diet X   Routine physical activity and/or cardiac rehabilitation X

## 2017-07-01 NOTE — Progress Notes (Signed)
Health Questionnaire  English version for the [place]    By placing a checkmark in one box in each group below, please indicate which statements best describe your own health state today.     Mobility   I have no problems in walking about X  I have some problems in walking about ?  I am confined to bed ?     Self-Care   I have no problems with self-care X  I have some problems washing or dressing myself ?  I am unable to wash or dress myself ?     Usual Activities (e.g. work, study, housework, family or leisure activities)   I have no problems with performing my usual activities X  I have some problems with performing my usual activities ?  I am unable to perform my usual activities ?     Pain / Discomfort   I have no pain or discomfort X  I have moderate pain or discomfort ?  I have extreme pain or discomfort ?     Anxiety / Depression   I am not anxious or depressed X  I am moderately anxious or depressed ?  I am extremely anxious or depressed ?   To help people say how good or bad a health state is, we have drawn a scale (rather like a thermometer) on which the best state you can imagine is marked 100 and the worst state you can imagine is marked 0.    We would like you to indicate on this scale how good or bad your own health is today, in your opinion. Please do this by drawing a line from the box below to whichever point on the scale indicates how good or bad your health state is today.   95

## 2017-07-07 NOTE — Telephone Encounter (Signed)
Pt called back to schedule appt.

## 2017-07-22 NOTE — Progress Notes (Signed)
Late Entry:  Pt signed reconsent 06/30/17 @ [phone] Subject met inclusion and exclusion criteria. The informed consent form, study requirements and expectation were reviewed with the subject and questions and concerns were addressed prior to the signing of the consent form. The subject verbalized understanding of the trial requirements. The subject agreed to participate in the AEGIS II trial and singed the informed consent. The informed consent was obtained prior to performance of any protocol-specific procedure for the subject. A copy of the signed informed consent was given to the subject and a copy was placed in the subject's medical record.  Subject re-consented to [place]  Version 2.0 IRB approved 04/28/2017

## 2017-09-23 ENCOUNTER — Telehealth: Payer: Self-pay | Admitting: *Deleted

## 2017-10-06 ENCOUNTER — Encounter: Payer: Self-pay | Admitting: *Deleted

## 2017-10-06 VITALS — BP 130/76 | HR 72 | Wt 212.0 lb

## 2017-10-06 DIAGNOSIS — Z006 Encounter for examination for normal comparison and control in clinical research program: Secondary | ICD-10-CM

## 2017-10-06 NOTE — Progress Notes (Signed)
[facility]egis Visit 9  Pt doing well. No complaints of pain or sob. No med changes.                                    "CONSENT"   YES     NO   Continuing further Investigational Product and study visits for follow-up? [x]  []   Continuing consent from future biomedical research [x]  []                                   "EVENTS"    YES     NO  AE   (IF YES SEE SOURCE) []  [x]   SAE  (IF YES SEE SOURCE) []  [x]   ENDPOINT   (IF YES SEE SOURCE) []  [x]   REVASCULARIZATION  (IF YES SEE SOURCE) []  [x]   AMPUTATION   (IF YES SEE SOURCE) []  [x]   TROPONIN'S  (IF YES SEE SOURCE) []  [x]     Current Outpatient Medications:  .  acetaminophen (TYLENOL) 325 MG tablet, Take 2 tablets (650 mg total) by mouth every 6 (six) hours as needed for mild pain or headache., Disp: , Rfl:  .  aspirin EC 81 MG EC tablet, Take 1 tablet (81 mg total) by mouth daily., Disp: , Rfl:  .  atorvastatin (LIPITOR) 40 MG tablet, Take 2 tablets (80 mg total) by mouth daily at 6 PM., Disp: 180 tablet, Rfl: 3 .  clopidogrel (PLAVIX) 75 MG tablet, TAKE 4 TABLETS BY MOUTH ON DAY 1, TAKE 1 TABLET BY MOUTH DAILY, Disp: 94 tablet, Rfl: 3 .  lisinopril (PRINIVIL,ZESTRIL) 5 MG tablet, Take 1 tablet (5 mg total) by mouth daily., Disp: 90 tablet, Rfl: 3 .  metoprolol tartrate (LOPRESSOR) 25 MG tablet, Take 1 tablet (25 mg total) by mouth 2 (two) times daily., Disp: 180 tablet, Rfl: 3 .  nitroGLYCERIN (NITROSTAT) 0.4 MG SL tablet, Place 1 tablet (0.4 mg total) under the tongue every 5 (five) minutes as needed for chest pain (CP or SOB)., Disp: 25 tablet, Rfl: 2

## 2017-10-16 NOTE — Telephone Encounter (Signed)
Appointment was made

## 2017-12-11 NOTE — Research (Signed)
Visit was 7 days out of window.   Lifestyle Adherence Assessment:    YES NO  Abstinence from smoking/remaining tobacco free X   Cardiac Diet X   Routine physical activity and/or cardiac rehabilitation X

## 2017-12-14 ENCOUNTER — Telehealth: Payer: Self-pay | Admitting: *Deleted

## 2017-12-25 ENCOUNTER — Encounter: Payer: Self-pay | Admitting: *Deleted

## 2017-12-25 DIAGNOSIS — Z006 Encounter for examination for normal comparison and control in clinical research program: Secondary | ICD-10-CM

## 2017-12-28 NOTE — Research (Signed)
AEGIS Visit 10 phone call  Pt doing ok, having some shortness of breath even when at rest. No chest pains noted. He is working. Will let Dr. Eldridge DaceVaranasi his primary cardiologist know.                                    "CONSENT"   YES     NO   Continuing further Investigational Product and study visits for follow-up? [x]  []   Continuing consent from future biomedical research [x]  []                                    "EVENTS"    YES     NO  AE   (IF YES SEE SOURCE) [x]  []   SAE  (IF YES SEE SOURCE) []  [x]   ENDPOINT   (IF YES SEE SOURCE) []  [x]   REVASCULARIZATION  (IF YES SEE SOURCE) []  [x]   AMPUTATION   (IF YES SEE SOURCE) []  [x]   TROPONIN'S  (IF YES SEE SOURCE) []  [x]     Current Outpatient Medications:  .  acetaminophen (TYLENOL) 325 MG tablet, Take 2 tablets (650 mg total) by mouth every 6 (six) hours as needed for mild pain or headache., Disp: , Rfl:  .  aspirin EC 81 MG EC tablet, Take 1 tablet (81 mg total) by mouth daily., Disp: , Rfl:  .  atorvastatin (LIPITOR) 40 MG tablet, Take 2 tablets (80 mg total) by mouth daily at 6 PM., Disp: 180 tablet, Rfl: 3 .  clopidogrel (PLAVIX) 75 MG tablet, TAKE 4 TABLETS BY MOUTH ON DAY 1, TAKE 1 TABLET BY MOUTH DAILY, Disp: 94 tablet, Rfl: 3 .  lisinopril (PRINIVIL,ZESTRIL) 5 MG tablet, Take 1 tablet (5 mg total) by mouth daily., Disp: 90 tablet, Rfl: 3 .  metoprolol tartrate (LOPRESSOR) 25 MG tablet, Take 1 tablet (25 mg total) by mouth 2 (two) times daily., Disp: 180 tablet, Rfl: 3 .  nitroGLYCERIN (NITROSTAT) 0.4 MG SL tablet, Place 1 tablet (0.4 mg total) under the tongue every 5 (five) minutes as needed for chest pain (CP or SOB)., Disp: 25 tablet, Rfl: 2

## 2017-12-28 NOTE — Telephone Encounter (Signed)
Spoke with pt on phone.

## 2018-03-25 ENCOUNTER — Encounter: Payer: Self-pay | Admitting: *Deleted

## 2018-03-25 VITALS — BP 133/78 | HR 70 | Resp 14 | Wt 225.0 lb

## 2018-03-25 DIAGNOSIS — Z006 Encounter for examination for normal comparison and control in clinical research program: Secondary | ICD-10-CM

## 2018-03-25 NOTE — Research (Signed)
Pt doing well, no complaints of cp or sob. His only complaint is he don't have no energy. He hasn't seen his cardiologist since Feb 2019,  I have reached out to cardiologist to get him an appointment.                                   "CONSENT"   YES     NO   Continuing further Investigational Product and study visits for follow-up? [x]  []   Continuing consent from future biomedical research [x]  []                                    "EVENTS"    YES     NO  AE   (IF YES SEE SOURCE) []  [x]   SAE  (IF YES SEE SOURCE) []  [x]   ENDPOINT   (IF YES SEE SOURCE) []  [x]   REVASCULARIZATION  (IF YES SEE SOURCE) []  [x]   AMPUTATION   (IF YES SEE SOURCE) []  [x]   TROPONIN'S  (IF YES SEE SOURCE) []  [x]    Lifestyle Adherence Assessment:    YES NO  Abstinence from smoking/remaining tobacco free X   Cardiac Diet X   Routine physical activity and/or cardiac rehabilitation X    [id number]  MOBILITY:    I HAVE NO PROBLEMS WALKING [x]   I HAVE SLIGHT PROBLEMS WALKING []   I HAVE MODERATE PROBLEMS WALKING []   I HAVE SEVERE PROBLEMS WALKING []   I AM UNABLE TO WALK  []     SELF-CARE:   I HAVE NO PROBLEMS WASING OR DRESSING MYSELF  [x]   I HAVE SLIGHT PROBLEMS WASHING OR DRESSING MYSELF  []   I HAVE MODERATE PROBLEMS WASHING OR DRESSING MYSELF []   I HAVE SEVERE PROBLEMS WASHING OR DRESSING MYSELF  []   I HAVE SEVERE PROBLEMS WASHING OR DRESSING MYSELF  []   I AM UNABLE TO WASH OR DRESS MYSELF []     USUAL ACTIVITIES: (E.G. WORK/STUDY/HOUSEWORK/FAMILY OR LEISURE ACTIVITIES.    I HAVE NO PROBLEMS DOING MY USUAL ACTIVITIES [x]   I HAVE SLIGHT PROBLEMS DOING MY USUAL ACTIVITIES []   I HAVE MODERATE PROBLEMS DOING MY USUAL ACTIVIITIES []   I HAVE SEVERE PROBLEMS DOING MY USUAL ACTIVITIES []   I AM UNABLE TO DO MY USUAL ACTIVITIES []     PAIN /DISCOMFORT   I HAVE NO PAIN OR DISCOMFORT [x]   I HAVE SLIGHT PAIN OR DISCOMFORT []   I HAVE MODERATE PAIN OR DISCOMFORT []   I HAVE SEVERE PAIN OR DISCOMFORT []   I HAVE EXTREME  PAIN OR DISCOMFORT []     ANXIETY/DEPRESSION   I AM NOT ANXIOUS OR DEPRESSED [x]   I AM SLIGHTLY ANXIOUS OR DEPRESSED []   I AM MODERATELY ANXIOUS OR DREPRESSED []   I AM SEVERELY ANXIOUS OR DEPRESSED []   I AM EXTREMELY ANXIOUS OR DEPRESSED []     SCALE OF 0-100 HOW WOULD YOU RATE TODAY?  0 IS THE WORSE AND 100 IS THE BEST HEALTH YOU CAN IMAGINE: 65     Current Outpatient Medications:  .  aspirin EC 81 MG EC tablet, Take 1 tablet (81 mg total) by mouth daily., Disp: , Rfl:  .  atorvastatin (LIPITOR) 40 MG tablet, Take 2 tablets (80 mg total) by mouth daily at 6 PM., Disp: 180 tablet, Rfl: 3 .  clopidogrel (PLAVIX) 75 MG tablet, TAKE 4 TABLETS BY MOUTH ON DAY 1, TAKE 1 TABLET BY  MOUTH DAILY, Disp: 94 tablet, Rfl: 3 .  lisinopril (PRINIVIL,ZESTRIL) 5 MG tablet, Take 1 tablet (5 mg total) by mouth daily., Disp: 90 tablet, Rfl: 3 .  metoprolol tartrate (LOPRESSOR) 25 MG tablet, Take 1 tablet (25 mg total) by mouth 2 (two) times daily., Disp: 180 tablet, Rfl: 3 .  acetaminophen (TYLENOL) 325 MG tablet, Take 2 tablets (650 mg total) by mouth every 6 (six) hours as needed for mild pain or headache. (Patient not taking: Reported on 03/25/2018), Disp: , Rfl:  .  nitroGLYCERIN (NITROSTAT) 0.4 MG SL tablet, Place 1 tablet (0.4 mg total) under the tongue every 5 (five) minutes as needed for chest pain (CP or SOB). (Patient not taking: Reported on 03/25/2018), Disp: 25 tablet, Rfl: 2

## 2018-04-13 ENCOUNTER — Encounter: Payer: Self-pay | Admitting: Interventional Cardiology

## 2018-04-13 NOTE — Progress Notes (Signed)
Cardiology Office Note   Date:  04/14/2018   ID:  Garrett Robertson, DOB 05/02/1950, MRN [id number]  PCP:  Patient, No Pcp Per    No chief complaint on file.  CAD/MI  Wt Readings from Last 3 Encounters:  04/14/18 227 lb 12.8 oz (103.3 kg)  03/25/18 225 lb (102.1 kg)  10/06/17 212 lb (96.2 kg)       History of Present Illness: Garrett Robertson is a 68 y.o. male  with a hx of HLD, current smoker, and NSTEMI 03/19/17. He presented to [facility] with acute onset of chest pain. His troponin was 2.85 and chest pain was relieved with nitro SL x 1. He was taken to the cath lab.   Mid RCA lesion is 95% stenosed.  A drug-eluting stent was successfully placed using a STENT SYNERGY DES 3X16.  Post intervention, there is a 0% residual stenosis.  Prox LAD lesion is 40% stenosed.  Prox Cx to Mid Cx lesion is 55% stenosed.  The left ventricular systolic function is normal.  LV end diastolic pressure is normal.  The left ventricular ejection fraction is 55-65% by visual estimate.   1. 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 Normal LV function 3. Normal LVEDP 4. Successful stenting of the mid RCA with DES.  ECHO : - Left ventricle: Systolic function was normal. The estimated   ejection fraction was in the range of 55% to 60%. Left   ventricular diastolic function parameters were normal. - Right atrium: some shadowing artifact in sub costal images - Atrial septum: There was increased thickness of the septum,   consistent with lipomatous hypertrophy. No defect or patent   foramen ovale was identified.  Since the last visit, he feels some DOE.  He quit smoking at the time of the MI.  He was a havy smoker.  He urinates frequently at night.    He stays active.  He has not been active.  He did not do cardiac rehab.     Past Medical History:  Diagnosis Date  . Dyslipidemia, goal LDL below 70   . Essential hypertension   . Former smoker   . NSTEMI (non-ST elevated myocardial  infarction) (HCC) 03/19/2017   DES to RCA  . Sleep apnea    "tested; had CPAP; lost ~ 40#; don't use CPAP anymore" (03/20/2017)    Past Surgical History:  Procedure Laterality Date  . CORONARY ANGIOPLASTY WITH STENT PLACEMENT  03/20/2017  . CORONARY STENT INTERVENTION N/A 03/20/2017   Procedure: CORONARY STENT INTERVENTION;  Surgeon: Swaziland, Peter M, MD;  Location: [facility] INVASIVE CV LAB;  Service: Cardiovascular;  Laterality: N/A;  . LEFT HEART CATH AND CORONARY ANGIOGRAPHY N/A 03/20/2017   Procedure: LEFT HEART CATH AND CORONARY ANGIOGRAPHY;  Surgeon: Swaziland, Peter M, MD;  Location: [facility] INVASIVE CV LAB;  Service: Cardiovascular;  Laterality: N/A;     Current Outpatient Medications  Medication Sig Dispense Refill  . acetaminophen (TYLENOL) 325 MG tablet Take 2 tablets (650 mg total) by mouth every 6 (six) hours as needed for mild pain or headache.    Marland Kitchen aspirin EC 81 MG EC tablet Take 1 tablet (81 mg total) by mouth daily.    Marland Kitchen atorvastatin (LIPITOR) 40 MG tablet Take 2 tablets (80 mg total) by mouth daily at 6 PM. 180 tablet 3  . clopidogrel (PLAVIX) 75 MG tablet TAKE 4 TABLETS BY MOUTH ON DAY 1, TAKE 1 TABLET BY MOUTH DAILY 94 tablet 3  . lisinopril (  PRINIVIL,ZESTRIL) 5 MG tablet Take 1 tablet (5 mg total) by mouth daily. 90 tablet 3  . metoprolol tartrate (LOPRESSOR) 25 MG tablet Take 1 tablet (25 mg total) by mouth 2 (two) times daily. 180 tablet 3  . nitroGLYCERIN (NITROSTAT) 0.4 MG SL tablet Place 1 tablet (0.4 mg total) under the tongue every 5 (five) minutes as needed for chest pain (CP or SOB). 25 tablet 2   No current facility-administered medications for this visit.     Allergies:   Bee venom    Social History:  The patient  reports that he quit smoking about 12 months ago. His smoking use included cigarettes. He has a 72.00 pack-year smoking history. He has never used smokeless tobacco. He reports current alcohol use of about 48.0 standard drinks of alcohol per week. He reports that  he does not use drugs.   Family History:  The patient's family history includes CAD in his father.    ROS:  Please see the history of present illness.   Otherwise, review of systems are positive for DOE.   All other systems are reviewed and negative.    PHYSICAL EXAM: VS:  BP 114/76   Pulse 73   Ht 6' (1.829 m)   Wt 227 lb 12.8 oz (103.3 kg)   SpO2 98%   BMI 30.90 kg/m  , BMI Body mass index is 30.9 kg/m. GEN: Well nourished, well developed, in no acute distress  HEENT: normal  Neck: no JVD, carotid bruits, or masses Cardiac: RRR; no murmurs, rubs, or gallops,no edema  Respiratory:  clear to auscultation bilaterally, normal work of breathing GI: soft, nontender, nondistended, + BS MS: no deformity or atrophy  Skin: warm and dry, no rash Neuro:  Strength and sensation are intact Psych: euthymic mood, full affect   EKG:   The ekg ordered today demonstrates NSR, PVCs, no ST changes   Recent Labs: 04/28/2017: ALT 22   Lipid Panel    Component Value Date/Time   CHOL 120 04/28/2017 0830   TRIG 57 04/28/2017 0830   HDL 44 04/28/2017 0830   CHOLHDL 2.7 04/28/2017 0830   CHOLHDL 4.5 03/20/2017 0359   VLDL 15 03/20/2017 0359   LDLCALC 65 04/28/2017 0830     Other studies Reviewed: Additional studies/ records that were reviewed today with results demonstrating: LDL 65 in 3/19.   ASSESSMENT AND PLAN:  1. CAD: s/p NSTEMI.  No CHF sx.  No angina on medical therapy.  COntinue aggressive secondary prevention.  2. HTN: The current medical regimen is effective;  continue present plan and medications. 3. Hyperlipidemia:  He needs to have repeat labs.  He will do in July 2020.  4. Former smoker: DOE.  Check PFTs to eval for emphysema.  He will do this after July, due to insurance reasons.  5. Frequent urination: Check PSA.  He would like to wait until July   Current medicines are reviewed at length with the patient today.  The patient concerns regarding his medicines were  addressed.  The following changes have been made:  No change  Labs/ tests ordered today include:  No orders of the defined types were placed in this encounter.   Recommend 150 minutes/week of aerobic exercise Low fat, low carb, high fiber diet recommended  Disposition:   FU in 6-7 months   Signed, Lance Muss, MD  04/14/2018 11:59 AM    [facility] [address] [place], [place], [place]  [address] Phone: [phone]; Fax: [phone]   

## 2018-04-14 ENCOUNTER — Encounter (INDEPENDENT_AMBULATORY_CARE_PROVIDER_SITE_OTHER): Payer: Self-pay

## 2018-04-14 ENCOUNTER — Ambulatory Visit (INDEPENDENT_AMBULATORY_CARE_PROVIDER_SITE_OTHER): Payer: Self-pay | Admitting: Interventional Cardiology

## 2018-04-14 ENCOUNTER — Encounter: Payer: Self-pay | Admitting: Interventional Cardiology

## 2018-04-14 VITALS — BP 114/76 | HR 73 | Ht 72.0 in | Wt 227.8 lb

## 2018-04-14 DIAGNOSIS — Z87891 Personal history of nicotine dependence: Secondary | ICD-10-CM

## 2018-04-14 DIAGNOSIS — E785 Hyperlipidemia, unspecified: Secondary | ICD-10-CM

## 2018-04-14 DIAGNOSIS — I214 Non-ST elevation (NSTEMI) myocardial infarction: Secondary | ICD-10-CM

## 2018-04-14 DIAGNOSIS — Z006 Encounter for examination for normal comparison and control in clinical research program: Secondary | ICD-10-CM

## 2018-04-14 DIAGNOSIS — I10 Essential (primary) hypertension: Secondary | ICD-10-CM

## 2018-04-14 MED ORDER — LISINOPRIL 5 MG PO TABS: 5.0000 mg | ORAL_TABLET | Freq: Every day | ORAL | 3 refills | Status: DC

## 2018-04-14 MED ORDER — ATORVASTATIN CALCIUM 40 MG PO TABS: 80.0000 mg | ORAL_TABLET | Freq: Every day | ORAL | 3 refills | Status: DC

## 2018-04-14 MED ORDER — CLOPIDOGREL BISULFATE 75 MG PO TABS: 75.0000 mg | ORAL_TABLET | Freq: Every day | ORAL | 3 refills | Status: DC

## 2018-04-14 MED ORDER — METOPROLOL TARTRATE 25 MG PO TABS: 25.0000 mg | ORAL_TABLET | Freq: Two times a day (BID) | ORAL | 3 refills | Status: DC

## 2018-04-14 MED ORDER — NITROGLYCERIN 0.4 MG SL SUBL: 0.4000 mg | SUBLINGUAL_TABLET | SUBLINGUAL | 2 refills | Status: DC | PRN

## 2018-04-14 NOTE — Addendum Note (Signed)
Addended by: Corky Crafts on: 04/14/2018 12:42 PM   Modules accepted: Level of Service

## 2018-04-14 NOTE — Patient Instructions (Signed)
Medication Instructions:  Your physician has recommended you make the following change in your medication:   STOP: aspirin  Lab work: Your physician recommends that you return for FASTING lab work in: August for CMET, LIPIDS, PSA   If you have labs (blood work) drawn today and your tests are completely normal, you will receive your results only by: Marland Kitchen MyChart Message (if you have MyChart) OR . A paper copy in the mail If you have any lab test that is abnormal or we need to change your treatment, we will call you to review the results.  Testing/Procedures: Your physician has recommended that you have a pulmonary function test in August 2020. Pulmonary Function Tests are a group of tests that measure how well air moves in and out of your lungs.    Follow-Up: At [facility], you and your health needs are our priority.  As part of our continuing mission to provide you with exceptional heart care, we have created designated Provider Care Teams.  These Care Teams include your primary Cardiologist (physician) and Advanced Practice Providers (APPs -  Physician Assistants and Nurse Practitioners) who all work together to provide you with the care you need, when you need it. . You will need a follow up appointment in September 2020.  Please call our office 2 months in advance to schedule this appointment.  You may see Everette Rank, MD or one of the following Advanced Practice Providers on your designated Care Team:   . Robbie Lis, PA-C . Dayna Dunn, PA-C . Jacolyn Reedy, PA-C  Any Other Special Instructions Will Be Listed Below (If Applicable).

## 2018-07-28 ENCOUNTER — Telehealth: Payer: Self-pay | Admitting: *Deleted

## 2018-07-28 NOTE — Telephone Encounter (Signed)
Open in error

## 2018-12-10 IMAGING — DX DG CHEST 1V PORT
1 series · 1 of 1 positions shown · non-contrast
Comparison: None.

CLINICAL DATA: Acute onset of midsternal chest pain, diaphoresis
and shortness of breath.

EXAM:
PORTABLE CHEST 1 VIEW

[chest ap]
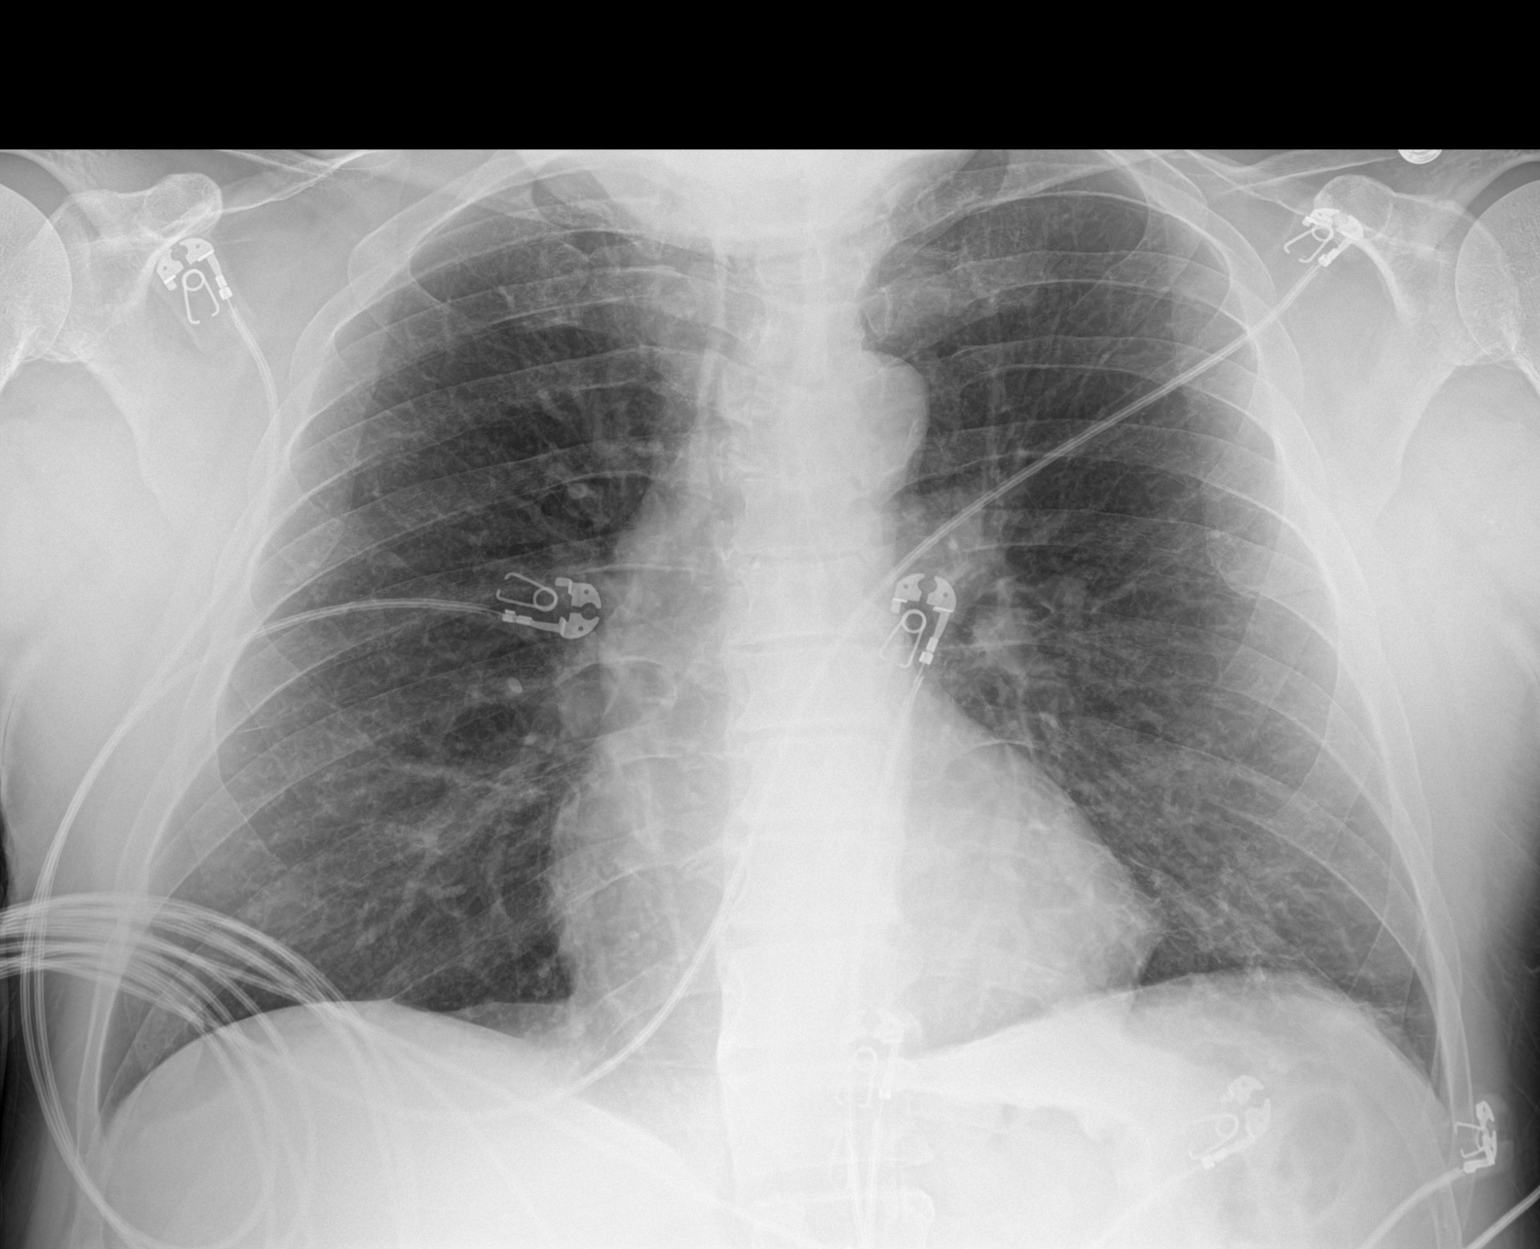

[1 of 1 positions shown; findings below may reference images not displayed]

FINDINGS: The lungs are well-aerated. Minimal left basilar opacity may reflect
atelectasis. There is no evidence of pleural effusion or
pneumothorax.

The cardiomediastinal silhouette is within normal limits. No acute
osseous abnormalities are seen.
IMPRESSION: Minimal left basilar opacity may reflect atelectasis.

## 2019-01-19 NOTE — Progress Notes (Signed)
Cardiology Office Note   Date:  01/20/2019   ID:  SEHAJ MCENROE, DOB 1950-12-26, MRN [id number]  PCP:  Patient, No Pcp Per    No chief complaint on file.  CAD/MI  Wt Readings from Last 3 Encounters:  01/20/19 240 lb 6.4 oz (109 kg)  04/14/18 227 lb 12.8 oz (103.3 kg)  03/25/18 225 lb (102.1 kg)       History of Present Illness: Garrett Robertson is a 68 y.o. male  with a hx of HLD, current smoker, and NSTEMI 03/19/17. He presented to [facility] with acute onset of chest pain. His troponin was 2.85 and chest pain was relieved with nitro SL x 1. He was taken to the cath lab.   Mid RCA lesion is 95% stenosed.  A drug-eluting stent was successfully placed using a STENT SYNERGY DES 3X16.  Post intervention, there is a 0% residual stenosis.  Prox LAD lesion is 40% stenosed.  Prox Cx to Mid Cx lesion is 55% stenosed.  The left ventricular systolic function is normal.  LV end diastolic pressure is normal.  The left ventricular ejection fraction is 55-65% by visual estimate.  1. 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 Normal LV function 3. Normal LVEDP 4. Successful stenting of the mid RCA with DES.  ECHO : - Left ventricle: Systolic function was normal. The estimated ejection fraction was in the range of 55% to 60%. Left ventricular diastolic function parameters were normal. - Right atrium: some shadowing artifact in sub costal images - Atrial septum: There was increased thickness of the septum, consistent with lipomatous hypertrophy. No defect or patent foramen ovale was identified.  Since the last visit, he has not been walking.  He quit smoking at the time of the MI.  He was a heavy smoker.  He reported urinating frequently at night- improved since the last visit, drinking less tea.    He did not do cardiac rehab.   He knows he needs to exercise more.    Past Medical History:  Diagnosis Date  . Dyslipidemia, goal LDL below 70   . Essential  hypertension   . Former smoker   . NSTEMI (non-ST elevated myocardial infarction) (HCC) 03/19/2017   DES to RCA  . Sleep apnea    "tested; had CPAP; lost ~ 40#; don't use CPAP anymore" (03/20/2017)    Past Surgical History:  Procedure Laterality Date  . CORONARY ANGIOPLASTY WITH STENT PLACEMENT  03/20/2017  . CORONARY STENT INTERVENTION N/A 03/20/2017   Procedure: CORONARY STENT INTERVENTION;  Surgeon: Swaziland, Peter M, MD;  Location: [facility] INVASIVE CV LAB;  Service: Cardiovascular;  Laterality: N/A;  . LEFT HEART CATH AND CORONARY ANGIOGRAPHY N/A 03/20/2017   Procedure: LEFT HEART CATH AND CORONARY ANGIOGRAPHY;  Surgeon: Swaziland, Peter M, MD;  Location: [facility] INVASIVE CV LAB;  Service: Cardiovascular;  Laterality: N/A;     Current Outpatient Medications  Medication Sig Dispense Refill  . acetaminophen (TYLENOL) 325 MG tablet Take 2 tablets (650 mg total) by mouth every 6 (six) hours as needed for mild pain or headache.    Marland Kitchen atorvastatin (LIPITOR) 40 MG tablet Take 2 tablets (80 mg total) by mouth daily at 6 PM. 180 tablet 3  . clopidogrel (PLAVIX) 75 MG tablet Take 1 tablet (75 mg total) by mouth daily. 90 tablet 3  . lisinopril (PRINIVIL,ZESTRIL) 5 MG tablet Take 1 tablet (5 mg total) by mouth daily. 90 tablet 3  . metoprolol tartrate (LOPRESSOR) 25 MG  tablet Take 1 tablet (25 mg total) by mouth 2 (two) times daily. 180 tablet 3  . nitroGLYCERIN (NITROSTAT) 0.4 MG SL tablet Place 1 tablet (0.4 mg total) under the tongue every 5 (five) minutes as needed for chest pain (CP or SOB). 25 tablet 2   No current facility-administered medications for this visit.    Allergies:   Bee venom    Social History:  The patient  reports that he quit smoking about 22 months ago. His smoking use included cigarettes. He has a 72.00 pack-year smoking history. He has never used smokeless tobacco. He reports current alcohol use of about 48.0 standard drinks of alcohol per week. He reports that he does not use drugs.    Family History:  The patient's family history includes CAD in his father.    ROS:  Please see the history of present illness.   Otherwise, review of systems are positive for weight gain.   All other systems are reviewed and negative.    PHYSICAL EXAM: VS:  BP 118/88   Pulse 76   Ht 6' (1.829 m)   Wt 240 lb 6.4 oz (109 kg)   SpO2 94%   BMI 32.60 kg/m  , BMI Body mass index is 32.6 kg/m. GEN: Well nourished, well developed, in no acute distress  HEENT: normal  Neck: no JVD, carotid bruits, or masses Cardiac: RRR; no murmurs, rubs, or gallops,no edema  Respiratory:  clear to auscultation bilaterally, normal work of breathing GI: soft, nontender, nondistended, + BS MS: no deformity or atrophy  Skin: warm and dry, no rash Neuro:  Strength and sensation are intact Psych: euthymic mood, full affect   EKG:   The ekg ordered 04/2018 demonstrates NSR, inferior Q waves   Recent Labs: No results found for requested labs within last 8760 hours.   Lipid Panel    Component Value Date/Time   CHOL 120 04/28/2017 0830   TRIG 57 04/28/2017 0830   HDL 44 04/28/2017 0830   CHOLHDL 2.7 04/28/2017 0830   CHOLHDL 4.5 03/20/2017 0359   VLDL 15 03/20/2017 0359   LDLCALC 65 04/28/2017 0830     Other studies Reviewed: Additional studies/ records that were reviewed today with results demonstrating: LDL 65.   ASSESSMENT AND PLAN:  1. CAD/Old MI: No angina.  Continue aggressive secondary prevention.  Increase exercise.  Heart healthy diet.  2. HTN: The current medical regimen is effective;  continue present plan and medications. 3. Hyperlipidemia: Check labs.  4. Former smoker: Continues to maintain avoiding tobacco.  5. Flu shot recommended.  He will consider it but has not typically gotten them in the past.  I also recommended that he see a PMD.  Erectile dysfunction: OK to use Viagra, 60-100 mg 1 hour prior.  Will call in to [facility].  Not using NTG.   Current medicines are reviewed  at length with the patient today.  The patient concerns regarding his medicines were addressed.  The following changes have been made:  No change  Labs/ tests ordered today include: CBC, CMet, lipid No orders of the defined types were placed in this encounter.   Recommend 150 minutes/week of aerobic exercise Low fat, low carb, high fiber diet recommended  Disposition:   FU in 1 year   Signed, Larae Grooms, MD  01/20/2019 8:48 AM    [facility] [place], [place], [place]  [address] Phone: [phone]; Fax: [phone]

## 2019-01-20 ENCOUNTER — Other Ambulatory Visit: Payer: Self-pay

## 2019-01-20 ENCOUNTER — Encounter: Payer: Self-pay | Admitting: Interventional Cardiology

## 2019-01-20 ENCOUNTER — Ambulatory Visit (INDEPENDENT_AMBULATORY_CARE_PROVIDER_SITE_OTHER): Payer: Medicare Other | Admitting: Interventional Cardiology

## 2019-01-20 VITALS — BP 118/88 | HR 76 | Ht 72.0 in | Wt 240.4 lb

## 2019-01-20 DIAGNOSIS — E782 Mixed hyperlipidemia: Secondary | ICD-10-CM

## 2019-01-20 DIAGNOSIS — I252 Old myocardial infarction: Secondary | ICD-10-CM | POA: Diagnosis not present

## 2019-01-20 DIAGNOSIS — Z87891 Personal history of nicotine dependence: Secondary | ICD-10-CM | POA: Diagnosis not present

## 2019-01-20 DIAGNOSIS — Z23 Encounter for immunization: Secondary | ICD-10-CM

## 2019-01-20 DIAGNOSIS — I25118 Atherosclerotic heart disease of native coronary artery with other forms of angina pectoris: Secondary | ICD-10-CM

## 2019-01-20 DIAGNOSIS — N529 Male erectile dysfunction, unspecified: Secondary | ICD-10-CM

## 2019-01-20 LAB — COMPREHENSIVE METABOLIC PANEL
ALT: 38 IU/L (ref 0–44)
AST: 27 IU/L (ref 0–40)
Albumin/Globulin Ratio: 1.9 (ref 1.2–2.2)
Albumin: 4.7 g/dL (ref 3.8–4.8)
Alkaline Phosphatase: 77 IU/L (ref 39–117)
BUN/Creatinine Ratio: 13 (ref 10–24)
BUN: 18 mg/dL (ref 8–27)
Bilirubin Total: 0.3 mg/dL (ref 0.0–1.2)
CO2: 23 mmol/L (ref 20–29)
Calcium: 9.4 mg/dL (ref 8.6–10.2)
Chloride: 103 mmol/L (ref 96–106)
Creatinine, Ser: 1.39 mg/dL — ABNORMAL HIGH (ref 0.76–1.27)
GFR calc Af Amer: 60 mL/min/{1.73_m2} (ref >59)
GFR calc non Af Amer: 52 mL/min/{1.73_m2} — ABNORMAL LOW (ref >59)
Globulin, Total: 2.5 g/dL (ref 1.5–4.5)
Glucose: 112 mg/dL — ABNORMAL HIGH (ref 65–99)
Potassium: 4 mmol/L (ref 3.5–5.2)
Sodium: 140 mmol/L (ref 134–144)
Total Protein: 7.2 g/dL (ref 6.0–8.5)

## 2019-01-20 LAB — CBC
Hematocrit: 39 % (ref 37.5–51.0)
Hemoglobin: 13.5 g/dL (ref 13.0–17.7)
MCH: 32.5 pg (ref 26.6–33.0)
MCHC: 34.6 g/dL (ref 31.5–35.7)
MCV: 94 fL (ref 79–97)
Platelets: 222 10*3/uL (ref 150–450)
RBC: 4.16 x10E6/uL (ref 4.14–5.80)
RDW: 12.7 % (ref 11.6–15.4)
WBC: 7.6 10*3/uL (ref 3.4–10.8)

## 2019-01-20 LAB — LIPID PANEL
Chol/HDL Ratio: 3.2 ratio (ref 0.0–5.0)
Cholesterol, Total: 113 mg/dL (ref 100–199)
HDL: 35 mg/dL — ABNORMAL LOW (ref >39)
LDL Chol Calc (NIH): 58 mg/dL (ref 0–99)
Triglycerides: 109 mg/dL (ref 0–149)
VLDL Cholesterol Cal: 20 mg/dL (ref 5–40)

## 2019-01-20 MED ORDER — LISINOPRIL 5 MG PO TABS: 5.0000 mg | ORAL_TABLET | Freq: Every day | ORAL | 3 refills | Status: DC

## 2019-01-20 MED ORDER — ATORVASTATIN CALCIUM 40 MG PO TABS: 80.0000 mg | ORAL_TABLET | Freq: Every day | ORAL | 3 refills | Status: DC

## 2019-01-20 MED ORDER — SILDENAFIL CITRATE 20 MG PO TABS: ORAL_TABLET | ORAL | 3 refills | Status: DC

## 2019-01-20 MED ORDER — METOPROLOL TARTRATE 25 MG PO TABS: 25.0000 mg | ORAL_TABLET | Freq: Two times a day (BID) | ORAL | 3 refills | Status: DC

## 2019-01-20 MED ORDER — CLOPIDOGREL BISULFATE 75 MG PO TABS: 75.0000 mg | ORAL_TABLET | Freq: Every day | ORAL | 3 refills | Status: DC

## 2019-01-20 MED ORDER — NITROGLYCERIN 0.4 MG SL SUBL: 0.4000 mg | SUBLINGUAL_TABLET | SUBLINGUAL | 3 refills | Status: DC | PRN

## 2019-01-20 NOTE — Patient Instructions (Signed)
Medication Instructions:  Your physician has recommended you make the following change in your medication:   START: sildenafil 20 mg tablet: Take 3-5 tablets 1 hour prior to sexual activity  *If you need a refill on your cardiac medications before your next appointment, please call your pharmacy*  Lab Work: TODAY: CMET, LIPIDS, CBC  If you have labs (blood work) drawn today and your tests are completely normal, you will receive your results only by:  [place] (if you have MyChart) OR  A paper copy in the mail If you have any lab test that is abnormal or we need to change your treatment, we will call you to review the results.  Testing/Procedures: None ordered  Follow-Up: At [facility], you and your health needs are our priority.  As part of our continuing mission to provide you with exceptional heart care, we have created designated Provider Care Teams.  These Care Teams include your primary Cardiologist (physician) and Advanced Practice Providers (APPs -  Physician Assistants and Nurse Practitioners) who all work together to provide you with the care you need, when you need it.  Your next appointment:   12 month(s)  The format for your next appointment:   In Person  Provider:   You may see Larae Grooms, MD or one of the following Advanced Practice Providers on your designated Care Team:    Melina Copa, PA-C  Ermalinda Barrios, PA-C   Other Instructions  Heart-Healthy Eating Plan Heart-healthy meal planning includes:  Eating less unhealthy fats.  Eating more healthy fats.  Making other changes in your diet. Talk with your doctor or a diet specialist (dietitian) to create an eating plan that is right for you. What is my plan? Your doctor may recommend an eating plan that includes:  Total fat: ______% or less of total calories a day.  Saturated fat: ______% or less of total calories a day.  Cholesterol: less than _________mg a day. What are tips for  following this plan? Cooking Avoid frying your food. Try to bake, boil, grill, or broil it instead. You can also reduce fat by:  Removing the skin from poultry.  Removing all visible fats from meats.  Steaming vegetables in water or broth. Meal planning   At meals, divide your plate into four equal parts: ? Fill one-half of your plate with vegetables and green salads. ? Fill one-fourth of your plate with whole grains. ? Fill one-fourth of your plate with lean protein foods.  Eat 4-5 servings of vegetables per day. A serving of vegetables is: ? 1 cup of raw or cooked vegetables. ? 2 cups of raw leafy greens.  Eat 4-5 servings of fruit per day. A serving of fruit is: ? 1 medium whole fruit. ?  cup of dried fruit. ?  cup of fresh, frozen, or canned fruit. ?  cup of 100% fruit juice.  Eat more foods that have soluble fiber. These are apples, broccoli, carrots, beans, peas, and barley. Try to get 20-30 g of fiber per day.  Eat 4-5 servings of nuts, legumes, and seeds per week: ? 1 serving of dried beans or legumes equals  cup after being cooked. ? 1 serving of nuts is  cup. ? 1 serving of seeds equals 1 tablespoon. General information  Eat more home-cooked food. Eat less restaurant, buffet, and fast food.  Limit or avoid alcohol.  Limit foods that are high in starch and sugar.  Avoid fried foods.  Lose weight if you are overweight.  Keep track of how much salt (sodium) you eat. This is important if you have high blood pressure. Ask your doctor to tell you more about this.  Try to add vegetarian meals each week. Fats  Choose healthy fats. These include olive oil and canola oil, flaxseeds, walnuts, almonds, and seeds.  Eat more omega-3 fats. These include salmon, mackerel, sardines, tuna, flaxseed oil, and ground flaxseeds. Try to eat fish at least 2 times each week.  Check food labels. Avoid foods with trans fats or high amounts of saturated fat.  Limit  saturated fats. ? These are often found in animal products, such as meats, butter, and cream. ? These are also found in plant foods, such as palm oil, palm kernel oil, and coconut oil.  Avoid foods with partially hydrogenated oils in them. These have trans fats. Examples are stick margarine, some tub margarines, cookies, crackers, and other baked goods. What foods can I eat? Fruits All fresh, canned (in natural juice), or frozen fruits. Vegetables Fresh or frozen vegetables (raw, steamed, roasted, or grilled). Green salads. Grains Most grains. Choose whole wheat and whole grains most of the time. Rice and pasta, including brown rice and pastas made with whole wheat. Meats and other proteins Lean, well-trimmed beef, veal, pork, and lamb. Chicken and [place] without skin. All fish and shellfish. Wild duck, rabbit, pheasant, and venison. Egg whites or low-cholesterol egg substitutes. Dried beans, peas, lentils, and tofu. Seeds and most nuts. Dairy Low-fat or nonfat cheeses, including ricotta and mozzarella. Skim or 1% milk that is liquid, powdered, or evaporated. Buttermilk that is made with low-fat milk. Nonfat or low-fat yogurt. Fats and oils Non-hydrogenated (trans-free) margarines. Vegetable oils, including soybean, sesame, sunflower, olive, peanut, safflower, corn, canola, and cottonseed. Salad dressings or mayonnaise made with a vegetable oil. Beverages Mineral water. Coffee and tea. Diet carbonated beverages. Sweets and desserts Sherbet, gelatin, and fruit ice. Small amounts of dark chocolate. Limit all sweets and desserts. Seasonings and condiments All seasonings and condiments. The items listed above may not be a complete list of foods and drinks you can eat. Contact a dietitian for more options. What foods should I avoid? Fruits Canned fruit in heavy syrup. Fruit in cream or butter sauce. Fried fruit. Limit coconut. Vegetables Vegetables cooked in cheese, cream, or butter sauce.  Fried vegetables. Grains Breads that are made with saturated or trans fats, oils, or whole milk. Croissants. Sweet rolls. Donuts. High-fat crackers, such as cheese crackers. Meats and other proteins Fatty meats, such as hot dogs, ribs, sausage, bacon, rib-eye roast or steak. High-fat deli meats, such as salami and bologna. Caviar. Domestic duck and goose. Organ meats, such as liver. Dairy Cream, sour cream, cream cheese, and creamed cottage cheese. Whole-milk cheeses. Whole or 2% milk that is liquid, evaporated, or condensed. Whole buttermilk. Cream sauce or high-fat cheese sauce. Yogurt that is made from whole milk. Fats and oils Meat fat, or shortening. Cocoa butter, hydrogenated oils, palm oil, coconut oil, palm kernel oil. Solid fats and shortenings, including bacon fat, salt pork, lard, and butter. Nondairy cream substitutes. Salad dressings with cheese or sour cream. Beverages Regular sodas and juice drinks with added sugar. Sweets and desserts Frosting. Pudding. Cookies. Cakes. Pies. Milk chocolate or white chocolate. Buttered syrups. Full-fat ice cream or ice cream drinks. The items listed above may not be a complete list of foods and drinks to avoid. Contact a dietitian for more information. Summary  Heart-healthy meal planning includes eating less unhealthy fats, eating more healthy  fats, and making other changes in your diet.  Eat a balanced diet. This includes fruits and vegetables, low-fat or nonfat dairy, lean protein, nuts and legumes, whole grains, and heart-healthy oils and fats. This information is not intended to replace advice given to you by your health care provider. Make sure you discuss any questions you have with your health care provider. Document Released: 07/29/2011 Document Revised: 04/02/2017 Document Reviewed: 03/06/2017 Elsevier Patient Education  2020 [employer]

## 2019-01-21 ENCOUNTER — Other Ambulatory Visit: Payer: Self-pay | Admitting: Interventional Cardiology

## 2020-01-23 ENCOUNTER — Other Ambulatory Visit: Payer: Self-pay

## 2020-01-23 MED ORDER — LISINOPRIL 5 MG PO TABS: 5.0000 mg | ORAL_TABLET | Freq: Every day | ORAL | 0 refills | Status: DC

## 2020-01-23 MED ORDER — METOPROLOL TARTRATE 25 MG PO TABS: 25.0000 mg | ORAL_TABLET | Freq: Two times a day (BID) | ORAL | 0 refills | Status: DC

## 2020-01-23 MED ORDER — ATORVASTATIN CALCIUM 40 MG PO TABS: 80.0000 mg | ORAL_TABLET | Freq: Every day | ORAL | 0 refills | Status: DC

## 2020-01-23 MED ORDER — CLOPIDOGREL BISULFATE 75 MG PO TABS: 75.0000 mg | ORAL_TABLET | Freq: Every day | ORAL | 0 refills | Status: DC

## 2020-01-23 NOTE — Telephone Encounter (Signed)
Pt's medications were sent to pt's pharmacy as requested. Confirmation received.  

## 2020-02-14 NOTE — Progress Notes (Deleted)
Cardiology Office Note   Date:  02/14/2020   ID:  Garrett Robertson, DOB Mar 26, 1950, MRN [id number]  PCP:  Patient, No Pcp Per    No chief complaint on file.  CAD  Wt Readings from Last 3 Encounters:  01/20/19 240 lb 6.4 oz (109 kg)  04/14/18 227 lb 12.8 oz (103.3 kg)  03/25/18 225 lb (102.1 kg)       History of Present Illness: Garrett Robertson is a 70 y.o. male  with a hx of HLD, smoking, and NSTEMI 03/19/17. He presented to [facility] with acute onset of chest pain. His troponin was 2.85 and chest pain was relieved with nitro SL x 1. He was taken to the cath lab.   Mid RCA lesion is 95% stenosed.  A drug-eluting stent was successfully placed using a STENT SYNERGY DES 3X16.  Post intervention, there is a 0% residual stenosis.  Prox LAD lesion is 40% stenosed.  Prox Cx to Mid Cx lesion is 55% stenosed.  The left ventricular systolic function is normal.  LV end diastolic pressure is normal.  The left ventricular ejection fraction is 55-65% by visual estimate.  1. 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 Normal LV function 3. Normal LVEDP 4. Successful stenting of the mid RCA with DES.  ECHO : - Left ventricle: Systolic function was normal. The estimated ejection fraction was in the range of 55% to 60%. Left ventricular diastolic function parameters were normal. - Right atrium: some shadowing artifact in sub costal images - Atrial septum: There was increased thickness of the septum, consistent with lipomatous hypertrophy. No defect or patent foramen ovale was identified.   He quit smoking at the time of the MI. He was a heavy smoker. He reported urinating frequently at night- improved with drinking less tea.   He did not do cardiac rehab.     Past Medical History:  Diagnosis Date  . Dyslipidemia, goal LDL below 70   . Essential hypertension   . Former smoker   . NSTEMI (non-ST elevated myocardial infarction) (HCC) 03/19/2017   DES to RCA   . Sleep apnea    "tested; had CPAP; lost ~ 40#; don't use CPAP anymore" (03/20/2017)    Past Surgical History:  Procedure Laterality Date  . CORONARY ANGIOPLASTY WITH STENT PLACEMENT  03/20/2017  . CORONARY STENT INTERVENTION N/A 03/20/2017   Procedure: CORONARY STENT INTERVENTION;  Surgeon: Swaziland, Peter M, MD;  Location: [facility] INVASIVE CV LAB;  Service: Cardiovascular;  Laterality: N/A;  . LEFT HEART CATH AND CORONARY ANGIOGRAPHY N/A 03/20/2017   Procedure: LEFT HEART CATH AND CORONARY ANGIOGRAPHY;  Surgeon: Swaziland, Peter M, MD;  Location: [facility] INVASIVE CV LAB;  Service: Cardiovascular;  Laterality: N/A;     Current Outpatient Medications  Medication Sig Dispense Refill  . acetaminophen (TYLENOL) 325 MG tablet Take 2 tablets (650 mg total) by mouth every 6 (six) hours as needed for mild pain or headache.    Marland Kitchen atorvastatin (LIPITOR) 40 MG tablet Take 2 tablets (80 mg total) by mouth daily at 6 PM. 60 tablet 0  . clopidogrel (PLAVIX) 75 MG tablet Take 1 tablet (75 mg total) by mouth daily. 30 tablet 0  . lisinopril (ZESTRIL) 5 MG tablet Take 1 tablet (5 mg total) by mouth daily. 30 tablet 0  . metoprolol tartrate (LOPRESSOR) 25 MG tablet Take 1 tablet (25 mg total) by mouth 2 (two) times daily. 60 tablet 0  . nitroGLYCERIN (NITROSTAT) 0.4 MG SL tablet  Place 1 tablet (0.4 mg total) under the tongue every 5 (five) minutes as needed for chest pain (CP or SOB). 25 tablet 3  . sildenafil (REVATIO) 20 MG tablet Take 3-5 tablets 1 hour prior to sexual activity 30 tablet 3   No current facility-administered medications for this visit.    Allergies:   Bee venom    Social History:  The patient  reports that he quit smoking about 2 years ago. His smoking use included cigarettes. He has a 72.00 pack-year smoking history. He has never used smokeless tobacco. He reports current alcohol use of about 48.0 standard drinks of alcohol per week. He reports that he does not use drugs.   Family History:  The  patient's ***family history includes CAD in his father.    ROS:  Please see the history of present illness.   Otherwise, review of systems are positive for ***.   All other systems are reviewed and negative.    PHYSICAL EXAM: VS:  There were no vitals taken for this visit. , BMI There is no height or weight on file to calculate BMI. GEN: Well nourished, well developed, in no acute distress  HEENT: normal  Neck: no JVD, carotid bruits, or masses Cardiac: ***RRR; no murmurs, rubs, or gallops,no edema  Respiratory:  clear to auscultation bilaterally, normal work of breathing GI: soft, nontender, nondistended, + BS MS: no deformity or atrophy  Skin: warm and dry, no rash Neuro:  Strength and sensation are intact Psych: euthymic mood, full affect   EKG:   The ekg ordered today demonstrates ***   Recent Labs: No results found for requested labs within last 8760 hours.   Lipid Panel    Component Value Date/Time   CHOL 113 01/20/2019 0922   TRIG 109 01/20/2019 0922   HDL 35 (L) 01/20/2019 0922   CHOLHDL 3.2 01/20/2019 0922   CHOLHDL 4.5 03/20/2017 0359   VLDL 15 03/20/2017 0359   LDLCALC 58 01/20/2019 0922     Other studies Reviewed: Additional studies/ records that were reviewed today with results demonstrating: ***.   ASSESSMENT AND PLAN:  1.   CAD/Old MI: 2.   HTN: 3.   Hyperlipidemia: 4.   Former smoker: 5.   Erectile dysfunction:   Current medicines are reviewed at length with the patient today.  The patient concerns regarding his medicines were addressed.  The following changes have been made:  No change***  Labs/ tests ordered today include: *** No orders of the defined types were placed in this encounter.   Recommend 150 minutes/week of aerobic exercise Low fat, low carb, high fiber diet recommended  Disposition:   FU in ***   Signed, Lance Muss, MD  02/14/2020 8:32 AM    [facility] [address] [place], [place], [place]   [address] Phone: [phone]; Fax: [phone]

## 2020-02-15 ENCOUNTER — Ambulatory Visit: Payer: Medicare Other | Admitting: Interventional Cardiology

## 2020-02-23 ENCOUNTER — Other Ambulatory Visit: Payer: Self-pay

## 2020-02-23 MED ORDER — METOPROLOL TARTRATE 25 MG PO TABS: 25.0000 mg | ORAL_TABLET | Freq: Two times a day (BID) | ORAL | 0 refills | Status: DC

## 2020-02-23 MED ORDER — LISINOPRIL 5 MG PO TABS: 5.0000 mg | ORAL_TABLET | Freq: Every day | ORAL | 0 refills | Status: DC

## 2020-02-23 MED ORDER — CLOPIDOGREL BISULFATE 75 MG PO TABS: 75.0000 mg | ORAL_TABLET | Freq: Every day | ORAL | 0 refills | Status: DC

## 2020-02-23 MED ORDER — ATORVASTATIN CALCIUM 40 MG PO TABS: 80.0000 mg | ORAL_TABLET | Freq: Every day | ORAL | 0 refills | Status: DC

## 2020-02-23 NOTE — Telephone Encounter (Signed)
Pt's medications were sent to pt's pharmacy as requested. Confirmation received.  

## 2020-03-13 NOTE — Progress Notes (Unsigned)
Cardiology Office Note   Date:  03/14/2020   ID:  SALEM Robertson, DOB 10-03-1950, MRN [id number]  PCP:  Patient, No Pcp Per    No chief complaint on file.  CAD  Wt Readings from Last 3 Encounters:  03/14/20 243 lb (110.2 kg)  01/20/19 240 lb 6.4 oz (109 kg)  04/14/18 227 lb 12.8 oz (103.3 kg)       History of Present Illness: Garrett Robertson is a 70 y.o. male  with a hx of HLD, current smoker, and NSTEMI 03/19/17. He presented to [facility] with acute onset of chest pain. His troponin was 2.85 and chest pain was relieved with nitro SL x 1. He was taken to the cath lab.   Mid RCA lesion is 95% stenosed.  A drug-eluting stent was successfully placed using a STENT SYNERGY DES 3X16.  Post intervention, there is a 0% residual stenosis.  Prox LAD lesion is 40% stenosed.  Prox Cx to Mid Cx lesion is 55% stenosed.  The left ventricular systolic function is normal.  LV end diastolic pressure is normal.  The left ventricular ejection fraction is 55-65% by visual estimate.  1. 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 Normal LV function 3. Normal LVEDP 4. Successful stenting of the mid RCA with DES.  ECHO : - Left ventricle: Systolic function was normal. The estimated ejection fraction was in the range of 55% to 60%. Left ventricular diastolic function parameters were normal. - Right atrium: some shadowing artifact in sub costal images - Atrial septum: There was increased thickness of the septum, consistent with lipomatous hypertrophy. No defect or patent foramen ovale was identified.  Since the last visit, he has not been walking. He quit smoking at the time of the MI. He was a heavy smoker.  He knows he needs to exercise more.    Past Medical History:  Diagnosis Date  . Dyslipidemia, goal LDL below 70   . Essential hypertension   . Former smoker   . NSTEMI (non-ST elevated myocardial infarction) (HCC) 03/19/2017   DES to RCA  . Sleep apnea     "tested; had CPAP; lost ~ 40#; don't use CPAP anymore" (03/20/2017)    Past Surgical History:  Procedure Laterality Date  . CORONARY ANGIOPLASTY WITH STENT PLACEMENT  03/20/2017  . CORONARY STENT INTERVENTION N/A 03/20/2017   Procedure: CORONARY STENT INTERVENTION;  Surgeon: Swaziland, Peter M, MD;  Location: [facility] INVASIVE CV LAB;  Service: Cardiovascular;  Laterality: N/A;  . LEFT HEART CATH AND CORONARY ANGIOGRAPHY N/A 03/20/2017   Procedure: LEFT HEART CATH AND CORONARY ANGIOGRAPHY;  Surgeon: Swaziland, Peter M, MD;  Location: [facility] INVASIVE CV LAB;  Service: Cardiovascular;  Laterality: N/A;     Current Outpatient Medications  Medication Sig Dispense Refill  . acetaminophen (TYLENOL) 325 MG tablet Take 2 tablets (650 mg total) by mouth every 6 (six) hours as needed for mild pain or headache.    Marland Kitchen atorvastatin (LIPITOR) 40 MG tablet Take 2 tablets (80 mg total) by mouth daily at 6 PM. Please keep upcoming appt in February 2022 with Dr. Eldridge Dace before anymore refills. Thank you Final Attempt 60 tablet 0  . clopidogrel (PLAVIX) 75 MG tablet Take 1 tablet (75 mg total) by mouth daily. Please keep upcoming appt in February 2022 with Dr. Eldridge Dace before anymore refills. Thank you Final Attempt 30 tablet 0  . lisinopril (ZESTRIL) 5 MG tablet Take 1 tablet (5 mg total) by mouth daily. Please keep upcoming  appt with Dr. Eldridge Dace in February 2022 before anymore refills. Thank you Final Attempt 30 tablet 0  . metoprolol tartrate (LOPRESSOR) 25 MG tablet Take 1 tablet (25 mg total) by mouth 2 (two) times daily. Please keep upcoming appt with Dr. Eldridge Dace in February 2022 before anymore refills. Thank you Final Attempt 60 tablet 0  . nitroGLYCERIN (NITROSTAT) 0.4 MG SL tablet Place 1 tablet (0.4 mg total) under the tongue every 5 (five) minutes as needed for chest pain (CP or SOB). 25 tablet 3  . sildenafil (REVATIO) 20 MG tablet Take 3-5 tablets 1 hour prior to sexual activity 30 tablet 3   No current  facility-administered medications for this visit.    Allergies:   Bee venom    Social History:  The patient  reports that he quit smoking about 2 years ago. His smoking use included cigarettes. He has a 72.00 pack-year smoking history. He has never used smokeless tobacco. He reports current alcohol use of about 48.0 standard drinks of alcohol per week. He reports that he does not use drugs.   Family History:  The patient's family history includes CAD in his father.    ROS:  Please see the history of present illness.   Otherwise, review of systems are positive for .   All other systems are reviewed and negative.    PHYSICAL EXAM: VS:  BP 120/72   Pulse 87   Ht 6' (1.829 m)   Wt 243 lb (110.2 kg)   SpO2 96%   BMI 32.96 kg/m  , BMI Body mass index is 32.96 kg/m. GEN: Well nourished, well developed, in no acute distress  HEENT: normal  Neck: no JVD, carotid bruits, or masses Cardiac: RRR; no murmurs, rubs, or gallops,no edema  Respiratory:  clear to auscultation bilaterally, normal work of breathing GI: soft, nontender, nondistended, + BS MS: no deformity or atrophy  Skin: warm and dry, no rash Neuro:  Strength and sensation are intact Psych: euthymic mood, full affect   EKG:   The ekg ordered today demonstrates NSR, inferior Q waves, no ST changes   Recent Labs: No results found for requested labs within last 8760 hours.   Lipid Panel    Component Value Date/Time   CHOL 113 01/20/2019 0922   TRIG 109 01/20/2019 0922   HDL 35 (L) 01/20/2019 0922   CHOLHDL 3.2 01/20/2019 0922   CHOLHDL 4.5 03/20/2017 0359   VLDL 15 03/20/2017 0359   LDLCALC 58 01/20/2019 0922     Other studies Reviewed: Additional studies/ records that were reviewed today with results demonstrating: cath records reviewed.   ASSESSMENT AND PLAN:  1. CAD/old MI: no angina. Continue aggressive secondary prevention.   2. HTN: The current medical regimen is effective;  continue present plan and  medications. 3. Hyperlipidemia: needs lipids rechecked.  Continue atorvastatin 80- mg daily. Whole food, plant based diet. 4. Former smoker: Continue to abstain.  5. Erectile dysfunction: ok to refill sildenafil. 6. Elevated blood glucose: cack A1C   Current medicines are reviewed at length with the patient today.  The patient concerns regarding his medicines were addressed.  The following changes have been made:  No change  Labs/ tests ordered today include: annula labs ordered No orders of the defined types were placed in this encounter.   Recommend 150 minutes/week of aerobic exercise Low fat, low carb, high fiber diet recommended  Disposition:   FU in 1 year   Signed, Lance Muss, MD  03/14/2020 4:57 PM  [facility] [place], [place], [place]  [address] Phone: [phone]; Fax: [phone]

## 2020-03-14 ENCOUNTER — Ambulatory Visit: Payer: Medicare Other | Admitting: Interventional Cardiology

## 2020-03-14 ENCOUNTER — Other Ambulatory Visit: Payer: Self-pay

## 2020-03-14 ENCOUNTER — Encounter: Payer: Self-pay | Admitting: Interventional Cardiology

## 2020-03-14 VITALS — BP 120/72 | HR 87 | Ht 72.0 in | Wt 243.0 lb

## 2020-03-14 DIAGNOSIS — N529 Male erectile dysfunction, unspecified: Secondary | ICD-10-CM

## 2020-03-14 DIAGNOSIS — R739 Hyperglycemia, unspecified: Secondary | ICD-10-CM

## 2020-03-14 DIAGNOSIS — Z87891 Personal history of nicotine dependence: Secondary | ICD-10-CM | POA: Diagnosis not present

## 2020-03-14 DIAGNOSIS — I25118 Atherosclerotic heart disease of native coronary artery with other forms of angina pectoris: Secondary | ICD-10-CM

## 2020-03-14 DIAGNOSIS — E782 Mixed hyperlipidemia: Secondary | ICD-10-CM | POA: Diagnosis not present

## 2020-03-14 DIAGNOSIS — I252 Old myocardial infarction: Secondary | ICD-10-CM | POA: Diagnosis not present

## 2020-03-14 MED ORDER — NITROGLYCERIN 0.4 MG SL SUBL: 0.4000 mg | SUBLINGUAL_TABLET | SUBLINGUAL | 3 refills | Status: DC | PRN

## 2020-03-14 MED ORDER — SILDENAFIL CITRATE 20 MG PO TABS: ORAL_TABLET | ORAL | 3 refills | Status: DC

## 2020-03-14 MED ORDER — METOPROLOL TARTRATE 25 MG PO TABS: 25.0000 mg | ORAL_TABLET | Freq: Two times a day (BID) | ORAL | 3 refills | Status: DC

## 2020-03-14 MED ORDER — LISINOPRIL 5 MG PO TABS: 5.0000 mg | ORAL_TABLET | Freq: Every day | ORAL | 3 refills | Status: DC

## 2020-03-14 MED ORDER — ATORVASTATIN CALCIUM 80 MG PO TABS: 80.0000 mg | ORAL_TABLET | Freq: Every day | ORAL | 3 refills | Status: DC

## 2020-03-14 MED ORDER — CLOPIDOGREL BISULFATE 75 MG PO TABS: 75.0000 mg | ORAL_TABLET | Freq: Every day | ORAL | 3 refills | Status: DC

## 2020-03-14 NOTE — Patient Instructions (Signed)
Medication Instructions:  Your physician has recommended you make the following change in your medication:  1.) TAKE atorvastatin 80mg  daily  *If you need a refill on your cardiac medications before your next appointment, please call your pharmacy*   Lab Work: CBC, CMET, FLP, HgbA1C in 1-2 weeks If you have labs (blood work) drawn today and your tests are completely normal, you will receive your results only by: MyChart Message (if you have MyChart) OR . A paper copy in the mail If you have any lab test that is abnormal or we need to change your treatment, we will call you to review the results.   Testing/Procedures: NONE   Follow-Up: At [facility], you and your health needs are our priority.  As part of our continuing mission to provide you with exceptional heart care, we have created designated Provider Care Teams.  These Care Teams include your primary Cardiologist (physician) and Advanced Practice Providers (APPs -  Physician Assistants and Nurse Practitioners) who all work together to provide you with the care you need, when you need it.  We recommend signing up for the patient portal called "MyChart".  Sign up information is provided on this After Visit Summary.  MyChart is used to connect with patients for Virtual Visits (Telemedicine).  Patients are able to view lab/test results, encounter notes, upcoming appointments, etc.  Non-urgent messages can be sent to your provider as well.   To learn more about what you can do with MyChart, go to [facility].    Your next appointment:   12 month(s)  The format for your next appointment:   In Person  Provider:   You may see [email or web address], MD or one of the following Advanced Practice Providers on your designated Care Team:    Lance Muss, PA-C  Ronie Spies, PA-C    Other Instructions  High-Fiber Eating Plan Fiber, also called dietary fiber, is a type of carbohydrate. It is found foods such as fruits,  vegetables, whole grains, and beans. A high-fiber diet can have many health benefits. Your health care provider may recommend a high-fiber diet to help:  Prevent constipation. Fiber can make your bowel movements more regular.  Lower your cholesterol.  Relieve the following conditions: ? Inflammation of veins in the anus (hemorrhoids). ? Inflammation of specific areas of the digestive tract (uncomplicated diverticulosis). ? A problem of the large intestine, also called the colon, that sometimes causes pain and diarrhea (irritable bowel syndrome, or IBS).  Prevent overeating as part of a weight-loss plan.  Prevent heart disease, type 2 diabetes, and certain cancers. What are tips for following this plan? Reading food labels  Check the nutrition facts label on food products for the amount of dietary fiber. Choose foods that have 5 grams of fiber or more per serving.  The goals for recommended daily fiber intake include: ? Men (age 16 or younger): 34-38 g. ? Men (over age 16): 28-34 g. ? Women (age 32 or younger): 25-28 g. ? Women (over age 82): 22-25 g. Your daily fiber goal is _____________ g.   Shopping  Choose whole fruits and vegetables instead of processed forms, such as apple juice or applesauce.  Choose a wide variety of high-fiber foods such as avocados, lentils, oats, and kidney beans.  Read the nutrition facts label of the foods you choose. Be aware of foods with added fiber. These foods often have high sugar and sodium amounts per serving. Cooking  Use whole-grain flour for baking and cooking.  [place] with brown rice instead of white rice. Meal planning  Start the day with a breakfast that is high in fiber, such as a cereal that contains 5 g of fiber or more per serving.  Eat breads and cereals that are made with whole-grain flour instead of refined flour or white flour.  Eat brown rice, bulgur wheat, or millet instead of white rice.  Use beans in place of meat in  soups, salads, and pasta dishes.  Be sure that half of the grains you eat each day are whole grains. General information  You can get the recommended daily intake of dietary fiber by: ? Eating a variety of fruits, vegetables, grains, nuts, and beans. ? Taking a fiber supplement if you are not able to take in enough fiber in your diet. It is better to get fiber through food than from a supplement.  Gradually increase how much fiber you consume. If you increase your intake of dietary fiber too quickly, you may have bloating, cramping, or gas.  Drink plenty of water to help you digest fiber.  Choose high-fiber snacks, such as berries, raw vegetables, nuts, and popcorn. What foods should I eat? Fruits Berries. Pears. Apples. Oranges. Avocado. Prunes and raisins. Dried figs. Vegetables Sweet potatoes. Spinach. Kale. Artichokes. Cabbage. Broccoli. Cauliflower. Green peas. Carrots. Squash. Grains Whole-grain breads. Multigrain cereal. Oats and oatmeal. Brown rice. Barley. Bulgur wheat. Millet. Quinoa. Bran muffins. Popcorn. Rye wafer crackers. Meats and other proteins Navy beans, kidney beans, and pinto beans. Soybeans. Split peas. Lentils. Nuts and seeds. Dairy Fiber-fortified yogurt. Beverages Fiber-fortified soy milk. Fiber-fortified orange juice. Other foods Fiber bars. The items listed above may not be a complete list of recommended foods and beverages. Contact a dietitian for more information. What foods should I avoid? Fruits Fruit juice. Cooked, strained fruit. Vegetables Fried potatoes. Canned vegetables. Well-cooked vegetables. Grains White bread. Pasta made with refined flour. White rice. Meats and other proteins Fatty cuts of meat. Fried chicken or fried fish. Dairy Milk. Yogurt. Cream cheese. Sour cream. Fats and oils Butters. Beverages Soft drinks. Other foods Cakes and pastries. The items listed above may not be a complete list of foods and beverages to avoid.  Talk with your dietitian about what choices are best for you. Summary  Fiber is a type of carbohydrate. It is found in foods such as fruits, vegetables, whole grains, and beans.  A high-fiber diet has many benefits. It can help to prevent constipation, lower blood cholesterol, aid weight loss, and reduce your risk of heart disease, diabetes, and certain cancers.  Increase your intake of fiber gradually. Increasing fiber too quickly may cause cramping, bloating, and gas. Drink plenty of water while you increase the amount of fiber you consume.  The best sources of fiber include whole fruits and vegetables, whole grains, nuts, seeds, and beans. This information is not intended to replace advice given to you by your health care provider. Make sure you discuss any questions you have with your health care provider. Document Revised: 06/02/2019 Document Reviewed: 06/02/2019 Elsevier Patient Education  2021 Elsevier Inc.   

## 2020-03-21 ENCOUNTER — Other Ambulatory Visit: Payer: Self-pay

## 2020-03-21 ENCOUNTER — Other Ambulatory Visit: Payer: Medicare Other

## 2020-03-21 DIAGNOSIS — E782 Mixed hyperlipidemia: Secondary | ICD-10-CM

## 2020-03-21 DIAGNOSIS — R739 Hyperglycemia, unspecified: Secondary | ICD-10-CM

## 2020-03-21 DIAGNOSIS — I25118 Atherosclerotic heart disease of native coronary artery with other forms of angina pectoris: Secondary | ICD-10-CM

## 2020-03-21 LAB — CBC
Hematocrit: 40.1 % (ref 37.5–51.0)
Hemoglobin: 13.8 g/dL (ref 13.0–17.7)
MCH: 31.7 pg (ref 26.6–33.0)
MCHC: 34.4 g/dL (ref 31.5–35.7)
MCV: 92 fL (ref 79–97)
Platelets: 205 10*3/uL (ref 150–450)
RBC: 4.36 x10E6/uL (ref 4.14–5.80)
RDW: 13.7 % (ref 11.6–15.4)
WBC: 6.8 10*3/uL (ref 3.4–10.8)

## 2020-03-22 LAB — COMPREHENSIVE METABOLIC PANEL
ALT: 39 IU/L (ref 0–44)
AST: 27 IU/L (ref 0–40)
Albumin/Globulin Ratio: 1.9 (ref 1.2–2.2)
Albumin: 4.6 g/dL (ref 3.8–4.8)
Alkaline Phosphatase: 79 IU/L (ref 44–121)
BUN/Creatinine Ratio: 12 (ref 10–24)
BUN: 17 mg/dL (ref 8–27)
Bilirubin Total: 0.5 mg/dL (ref 0.0–1.2)
CO2: 24 mmol/L (ref 20–29)
Calcium: 9.1 mg/dL (ref 8.6–10.2)
Chloride: 100 mmol/L (ref 96–106)
Creatinine, Ser: 1.38 mg/dL — ABNORMAL HIGH (ref 0.76–1.27)
GFR calc Af Amer: 60 mL/min/{1.73_m2} (ref >59)
GFR calc non Af Amer: 52 mL/min/{1.73_m2} — ABNORMAL LOW (ref >59)
Globulin, Total: 2.4 g/dL (ref 1.5–4.5)
Glucose: 114 mg/dL — ABNORMAL HIGH (ref 65–99)
Potassium: 4.2 mmol/L (ref 3.5–5.2)
Sodium: 139 mmol/L (ref 134–144)
Total Protein: 7 g/dL (ref 6.0–8.5)

## 2020-03-22 LAB — HEMOGLOBIN A1C
Est. average glucose Bld gHb Est-mCnc: 126 mg/dL
Hgb A1c MFr Bld: 6 % — ABNORMAL HIGH (ref 4.8–5.6)

## 2020-03-22 LAB — LIPID PANEL
Chol/HDL Ratio: 3.4 ratio (ref 0.0–5.0)
Cholesterol, Total: 112 mg/dL (ref 100–199)
HDL: 33 mg/dL — ABNORMAL LOW (ref >39)
LDL Chol Calc (NIH): 60 mg/dL (ref 0–99)
Triglycerides: 100 mg/dL (ref 0–149)
VLDL Cholesterol Cal: 19 mg/dL (ref 5–40)

## 2020-03-23 NOTE — Addendum Note (Signed)
Addended by: Winifred Olive on: 03/23/2020 04:17 PM   Modules accepted: Orders

## 2021-03-13 ENCOUNTER — Other Ambulatory Visit: Payer: Self-pay

## 2021-03-13 MED ORDER — CLOPIDOGREL BISULFATE 75 MG PO TABS: 75.0000 mg | ORAL_TABLET | Freq: Every day | ORAL | 0 refills | Status: DC

## 2021-03-13 MED ORDER — METOPROLOL TARTRATE 25 MG PO TABS: 25.0000 mg | ORAL_TABLET | Freq: Two times a day (BID) | ORAL | 0 refills | Status: DC

## 2021-03-13 MED ORDER — LISINOPRIL 5 MG PO TABS: 5.0000 mg | ORAL_TABLET | Freq: Every day | ORAL | 0 refills | Status: DC

## 2021-03-13 MED ORDER — ATORVASTATIN CALCIUM 80 MG PO TABS: 80.0000 mg | ORAL_TABLET | Freq: Every day | ORAL | 0 refills | Status: DC

## 2021-03-13 NOTE — Telephone Encounter (Signed)
Pt's medication was sent to pt's pharmacy as requested. Confirmation received.  °

## 2021-04-11 ENCOUNTER — Ambulatory Visit: Payer: Medicare Other | Admitting: Interventional Cardiology

## 2021-04-11 ENCOUNTER — Other Ambulatory Visit: Payer: Self-pay

## 2021-04-11 ENCOUNTER — Encounter: Payer: Self-pay | Admitting: Interventional Cardiology

## 2021-04-11 VITALS — BP 120/76 | HR 84 | Ht 72.0 in | Wt 253.0 lb

## 2021-04-11 DIAGNOSIS — R739 Hyperglycemia, unspecified: Secondary | ICD-10-CM

## 2021-04-11 DIAGNOSIS — E782 Mixed hyperlipidemia: Secondary | ICD-10-CM | POA: Diagnosis not present

## 2021-04-11 DIAGNOSIS — I252 Old myocardial infarction: Secondary | ICD-10-CM

## 2021-04-11 DIAGNOSIS — I25118 Atherosclerotic heart disease of native coronary artery with other forms of angina pectoris: Secondary | ICD-10-CM

## 2021-04-11 DIAGNOSIS — N529 Male erectile dysfunction, unspecified: Secondary | ICD-10-CM

## 2021-04-11 DIAGNOSIS — R635 Abnormal weight gain: Secondary | ICD-10-CM

## 2021-04-11 DIAGNOSIS — Z87891 Personal history of nicotine dependence: Secondary | ICD-10-CM

## 2021-04-11 MED ORDER — METOPROLOL TARTRATE 25 MG PO TABS: 25.0000 mg | ORAL_TABLET | Freq: Two times a day (BID) | ORAL | 3 refills | Status: DC

## 2021-04-11 MED ORDER — CLOPIDOGREL BISULFATE 75 MG PO TABS: 75.0000 mg | ORAL_TABLET | Freq: Every day | ORAL | 3 refills | Status: DC

## 2021-04-11 MED ORDER — NITROGLYCERIN 0.4 MG SL SUBL: 0.4000 mg | SUBLINGUAL_TABLET | SUBLINGUAL | 6 refills | Status: AC | PRN

## 2021-04-11 MED ORDER — TADALAFIL 20 MG PO TABS: 20.0000 mg | ORAL_TABLET | Freq: Every day | ORAL | 2 refills | Status: AC | PRN

## 2021-04-11 MED ORDER — ATORVASTATIN CALCIUM 80 MG PO TABS: 80.0000 mg | ORAL_TABLET | Freq: Every day | ORAL | 3 refills | Status: DC

## 2021-04-11 MED ORDER — LISINOPRIL 5 MG PO TABS: 5.0000 mg | ORAL_TABLET | Freq: Every day | ORAL | 3 refills | Status: DC

## 2021-04-11 NOTE — Progress Notes (Signed)
?  ?Cardiology Office Note ? ? ?Date:  04/11/2021  ? ?ID:  Garrett Robertson, DOB [birth year], MRN [id number] ? ?PCP:  Patient, No Pcp Per (Inactive)  ? ? ?Chief Complaint  ?Patient presents with  ? Follow-up  ? ?CAD ? ?Wt Readings from Last 3 Encounters:  ?04/11/21 253 lb (114.8 kg)  ?03/14/20 243 lb (110.2 kg)  ?01/20/19 240 lb 6.4 oz (109 kg)  ?  ? ?  ?History of Present Illness: ?Garrett Robertson is a 71 y.o. male  with a hx of HLD, current smoker, and NSTEMI 03/19/17. He presented to [facility] with acute onset of chest pain. His troponin was 2.85 and chest pain was relieved with nitro SL x 1. He was taken to the cath lab. ?  ?Mid RCA lesion is 95% stenosed. ?A drug-eluting stent was successfully placed using a STENT SYNERGY DES 3X16. ?Post intervention, there is a 0% residual stenosis. ?Prox LAD lesion is 40% stenosed. ?Prox Cx to Mid Cx lesion is 55% stenosed. ?The left ventricular systolic function is normal. ?LV end diastolic pressure is normal. ?The left ventricular ejection fraction is 55-65% by visual estimate. ?  ?1. 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 Normal LV function ?3. Normal LVEDP ?4. Successful stenting of the mid RCA with DES. ?  ?ECHO : ?- Left ventricle: Systolic function was normal. The estimated ?  ejection fraction was in the range of 55% to 60%. Left ?  ventricular diastolic function parameters were normal. ?- Right atrium: some shadowing artifact in sub costal images ?- Atrial septum: There was increased thickness of the septum, ?  consistent with lipomatous hypertrophy. No defect or patent ?  foramen ovale was identified. ?  ?He quit smoking at the time of the MI.  He was a heavy smoker. ? ?He has gained weight with smoking cessation. ? ?Denies : Chest pain. Dizziness. Leg edema. Nitroglycerin use. Orthopnea. Palpitations. Paroxysmal nocturnal dyspnea. Shortness of breath. Syncope.   ? ?Does some walking.  Less since he has gained the weight.  ? ?Eats meat on occasion.  Likes his fiber and  vegetables.  ? ? ?Past Medical History:  ?Diagnosis Date  ? Dyslipidemia, goal LDL below 70   ? Essential hypertension   ? Former smoker   ? NSTEMI (non-ST elevated myocardial infarction) (HCC) 03/19/2017  ? DES to RCA  ? Sleep apnea   ? "tested; had CPAP; lost ~ 40#; don't use CPAP anymore" (03/20/2017)  ? ? ?Past Surgical History:  ?Procedure Laterality Date  ? CORONARY ANGIOPLASTY WITH STENT PLACEMENT  03/20/2017  ? CORONARY STENT INTERVENTION N/A 03/20/2017  ? Procedure: CORONARY STENT INTERVENTION;  Surgeon: Swaziland, Peter M, MD;  Location: [facility] INVASIVE CV LAB;  Service: Cardiovascular;  Laterality: N/A;  ? LEFT HEART CATH AND CORONARY ANGIOGRAPHY N/A 03/20/2017  ? Procedure: LEFT HEART CATH AND CORONARY ANGIOGRAPHY;  Surgeon: Swaziland, Peter M, MD;  Location: [facility] INVASIVE CV LAB;  Service: Cardiovascular;  Laterality: N/A;  ? ? ? ?Current Outpatient Medications  ?Medication Sig Dispense Refill  ? acetaminophen (TYLENOL) 325 MG tablet Take 2 tablets (650 mg total) by mouth every 6 (six) hours as needed for mild pain or headache.    ? atorvastatin (LIPITOR) 80 MG tablet Take 1 tablet (80 mg total) by mouth daily. 90 tablet 0  ? clopidogrel (PLAVIX) 75 MG tablet Take 1 tablet (75 mg total) by mouth daily. 90 tablet 0  ? lisinopril (ZESTRIL) 5 MG tablet Take 1 tablet (5  mg total) by mouth daily. 90 tablet 0  ? metoprolol tartrate (LOPRESSOR) 25 MG tablet Take 1 tablet (25 mg total) by mouth 2 (two) times daily. 180 tablet 0  ? nitroGLYCERIN (NITROSTAT) 0.4 MG SL tablet Place 1 tablet (0.4 mg total) under the tongue every 5 (five) minutes as needed for chest pain (CP or SOB). 25 tablet 3  ? sildenafil (REVATIO) 20 MG tablet Take 3-5 tablets 1 hour prior to sexual activity 30 tablet 3  ? ?No current facility-administered medications for this visit.  ? ? ?Allergies:   Bee venom  ? ? ?Social History:  The patient  reports that he quit smoking about 4 years ago. His smoking use included cigarettes. He has a 72.00 pack-year  smoking history. He has never used smokeless tobacco. He reports current alcohol use of about 48.0 standard drinks per week. He reports that he does not use drugs.  ? ?Family History:  The patient's family history includes CAD in his father.  ? ? ?ROS:  Please see the history of present illness.   Otherwise, review of systems are positive for weight gain.   All other systems are reviewed and negative.  ? ? ?PHYSICAL EXAM: ?VS:  BP 120/76   Pulse 84   Ht 6' (1.829 m)   Wt 253 lb (114.8 kg)   SpO2 96%   BMI 34.31 kg/m?  , BMI Body mass index is 34.31 kg/m?. ?GEN: Well nourished, well developed, in no acute distress ?HEENT: normal ?Neck: no JVD, carotid bruits, or masses ?Cardiac: RRR; no murmurs, rubs, or gallops,no edema  ?Respiratory:  clear to auscultation bilaterally, normal work of breathing ?GI: soft, nontender, nondistended, + BS ?MS: no deformity or atrophy ?Skin: warm and dry, no rash ?Neuro:  Strength and sensation are intact ?Psych: euthymic mood, full affect ? ? ?EKG:   ?The ekg ordered today demonstrates NSR, inf Q waves, no ST changes; no change from prior ? ? ?Recent Labs: ?No results found for requested labs within last 8760 hours.  ? ?Lipid Panel ?   ?Component Value Date/Time  ? CHOL 112 03/21/2020 0934  ? TRIG 100 03/21/2020 0934  ? HDL 33 (L) 03/21/2020 0934  ? CHOLHDL 3.4 03/21/2020 0934  ? CHOLHDL 4.5 03/20/2017 0359  ? VLDL 15 03/20/2017 0359  ? LDLCALC 60 03/21/2020 0934  ? ?  ?Other studies Reviewed: ?Additional studies/ records that were reviewed today with results demonstrating: labs reviewed. ? ? ?ASSESSMENT AND PLAN: ? ?CAD/Old MI: No angina on medical therapy.  Continue clopidogrel monotherapy.  He needs aggressive secondary prevention.  He continues to abstain from smoking. ?HTN: The current medical regimen is effective;  continue present plan and medications.  Avoid excessive salt.  Avoid processed foods. ?Hyperlipidemia: LDL 60 in 2022.  Recheck lipids.  Continue high-dose  atorvastatin. ?Former smoker: COntinued to abstain.  ?Erectile dysfunction: Will try tadalafil 10 to 20 mg 1 hour prior.  Dispense 10 pills.  He is not using any nitroglycerin. ?Elevated blood glucose: Check A1c. ?Weight gain: check TSH.  ? ? ?Current medicines are reviewed at length with the patient today.  The patient concerns regarding his medicines were addressed. ? ?The following changes have been made:  No change ? ?Labs/ tests ordered today include:  ?No orders of the defined types were placed in this encounter. ? ? ?Recommend 150 minutes/week of aerobic exercise ?Low fat, low carb, high fiber diet recommended ? ?Disposition:   FU in 1 year ? ? ?Signed, ?Lance Muss,  MD  ?04/11/2021 3:46 PM    ?[facility] ?[address] [place], [place], [place]  [address] ?Phone: [phone]; Fax: [phone]  ? ?

## 2021-04-11 NOTE — Patient Instructions (Signed)
?  Your physician has recommended you make the following change in your medication: Stop Sildenafil. ?Start tadalafil 20 mg as needed.  Start with half tablet and increase to whole tablet if needed.  ? ? ? ?Lab Work: ?Your physician recommends that you return for lab work on April 16, 2021.  CBC, CMET, Lipids, A1C, TSH.  This will be fasting.  The lab opens at 7:15 AM ? ?If you have labs (blood work) drawn today and your tests are completely normal, you will receive your results only by: ?MyChart Message (if you have MyChart) OR ?A paper copy in the mail ?If you have any lab test that is abnormal or we need to change your treatment, we will call you to review the results. ? ? ?Testing/Procedures: ?none ? ? ?Follow-Up: ?At [facility], you and your health needs are our priority.  As part of our continuing mission to provide you with exceptional heart care, we have created designated Provider Care Teams.  These Care Teams include your primary Cardiologist (physician) and Advanced Practice Providers (APPs -  Physician Assistants and Nurse Practitioners) who all work together to provide you with the care you need, when you need it. ? ?We recommend signing up for the patient portal called "MyChart".  Sign up information is provided on this After Visit Summary.  MyChart is used to connect with patients for Virtual Visits (Telemedicine).  Patients are able to view lab/test results, encounter notes, upcoming appointments, etc.  Non-urgent messages can be sent to your provider as well.   ?To learn more about what you can do with MyChart, go to [email or web address].   ? ?Your next appointment:   ?12 month(s) ? ?The format for your next appointment:   ?In Person ? ?Provider:   ?Larae Grooms, MD   ? ? ?Other Instructions ? ? ?

## 2021-04-16 ENCOUNTER — Other Ambulatory Visit: Payer: Medicare Other

## 2021-04-23 ENCOUNTER — Other Ambulatory Visit: Payer: Self-pay

## 2021-04-23 ENCOUNTER — Other Ambulatory Visit: Payer: Medicare Other

## 2021-04-23 DIAGNOSIS — R739 Hyperglycemia, unspecified: Secondary | ICD-10-CM

## 2021-04-23 DIAGNOSIS — I25118 Atherosclerotic heart disease of native coronary artery with other forms of angina pectoris: Secondary | ICD-10-CM | POA: Diagnosis not present

## 2021-04-23 DIAGNOSIS — E782 Mixed hyperlipidemia: Secondary | ICD-10-CM | POA: Diagnosis not present

## 2021-04-23 DIAGNOSIS — I252 Old myocardial infarction: Secondary | ICD-10-CM

## 2021-04-23 DIAGNOSIS — R635 Abnormal weight gain: Secondary | ICD-10-CM

## 2021-04-23 LAB — LIPID PANEL
Chol/HDL Ratio: 3.3 ratio (ref 0.0–5.0)
Cholesterol, Total: 101 mg/dL (ref 100–199)
HDL: 31 mg/dL — ABNORMAL LOW (ref >39)
LDL Chol Calc (NIH): 52 mg/dL (ref 0–99)
Triglycerides: 88 mg/dL (ref 0–149)
VLDL Cholesterol Cal: 18 mg/dL (ref 5–40)

## 2021-04-23 LAB — COMPREHENSIVE METABOLIC PANEL
ALT: 32 IU/L (ref 0–44)
AST: 21 IU/L (ref 0–40)
Albumin/Globulin Ratio: 2 (ref 1.2–2.2)
Albumin: 4.6 g/dL (ref 3.8–4.8)
Alkaline Phosphatase: 72 IU/L (ref 44–121)
BUN/Creatinine Ratio: 14 (ref 10–24)
BUN: 19 mg/dL (ref 8–27)
Bilirubin Total: 0.4 mg/dL (ref 0.0–1.2)
CO2: 26 mmol/L (ref 20–29)
Calcium: 9.2 mg/dL (ref 8.6–10.2)
Chloride: 104 mmol/L (ref 96–106)
Creatinine, Ser: 1.35 mg/dL — ABNORMAL HIGH (ref 0.76–1.27)
Globulin, Total: 2.3 g/dL (ref 1.5–4.5)
Glucose: 103 mg/dL — ABNORMAL HIGH (ref 70–99)
Potassium: 5 mmol/L (ref 3.5–5.2)
Sodium: 142 mmol/L (ref 134–144)
Total Protein: 6.9 g/dL (ref 6.0–8.5)
eGFR: 57 mL/min/{1.73_m2} — ABNORMAL LOW (ref >59)

## 2021-04-23 LAB — CBC
Hematocrit: 38.7 % (ref 37.5–51.0)
Hemoglobin: 13.6 g/dL (ref 13.0–17.7)
MCH: 32.9 pg (ref 26.6–33.0)
MCHC: 35.1 g/dL (ref 31.5–35.7)
MCV: 94 fL (ref 79–97)
Platelets: 207 10*3/uL (ref 150–450)
RBC: 4.13 x10E6/uL — ABNORMAL LOW (ref 4.14–5.80)
RDW: 12.8 % (ref 11.6–15.4)
WBC: 6.3 10*3/uL (ref 3.4–10.8)

## 2021-04-23 LAB — TSH: TSH: 1.36 u[IU]/mL (ref 0.450–4.500)

## 2021-04-23 LAB — HEMOGLOBIN A1C
Est. average glucose Bld gHb Est-mCnc: 123 mg/dL
Hgb A1c MFr Bld: 5.9 % — ABNORMAL HIGH (ref 4.8–5.6)

## 2022-05-13 NOTE — Progress Notes (Unsigned)
Cardiology Office Note   Date:  05/14/2022   ID:  Garrett Robertson, DOB 08-20-1950, MRN [id number]  PCP:  Patient, No Pcp Per    No chief complaint on file.  CAD  Wt Readings from Last 3 Encounters:  05/14/22 257 lb 12.8 oz (116.9 kg)  04/11/21 253 lb (114.8 kg)  03/14/20 243 lb (110.2 kg)       History of Present Illness: Garrett Robertson is a 72 y.o. male   with a hx of HLD, current smoker, and NSTEMI 03/19/17. He presented to [facility] with acute onset of chest pain. His troponin was 2.85 and chest pain was relieved with nitro SL x 1. He was taken to the cath lab.   Mid RCA lesion is 95% stenosed. A drug-eluting stent was successfully placed using a STENT SYNERGY DES 3X16. Post intervention, there is a 0% residual stenosis. Prox LAD lesion is 40% stenosed. Prox Cx to Mid Cx lesion is 55% stenosed. The left ventricular systolic function is normal. LV end diastolic pressure is normal. The left ventricular ejection fraction is 55-65% by visual estimate.   1. 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 Normal LV function 3. Normal LVEDP 4. Successful stenting of the mid RCA with DES.   ECHO : - Left ventricle: Systolic function was normal. The estimated   ejection fraction was in the range of 55% to 60%. Left   ventricular diastolic function parameters were normal. - Right atrium: some shadowing artifact in sub costal images - Atrial septum: There was increased thickness of the septum,   consistent with lipomatous hypertrophy. No defect or patent   foramen ovale was identified.   He quit smoking at the time of the MI.  He was a heavy smoker.   He has gained weight with smoking cessation. In the past year, he started some gummies that are to help with weight loss advertised by Dr. Irena Cords and Otelia Limes.   Denies : Chest pain. Dizziness. Leg edema. Nitroglycerin use. Orthopnea. Palpitations. Paroxysmal nocturnal dyspnea. Shortness of breath. Syncope.   Walks some on his  property which [place] acres.  Has some hills to walk.   Past Medical History:  Diagnosis Date   Dyslipidemia, goal LDL below 70    Essential hypertension    Former smoker    NSTEMI (non-ST elevated myocardial infarction) 03/19/2017   DES to RCA   Sleep apnea    "tested; had CPAP; lost ~ 40#; don't use CPAP anymore" (03/20/2017)    Past Surgical History:  Procedure Laterality Date   CORONARY ANGIOPLASTY WITH STENT PLACEMENT  03/20/2017   CORONARY STENT INTERVENTION N/A 03/20/2017   Procedure: CORONARY STENT INTERVENTION;  Surgeon: Martinique, Peter M, MD;  Location: [place] CV LAB;  Service: Cardiovascular;  Laterality: N/A;   LEFT HEART CATH AND CORONARY ANGIOGRAPHY N/A 03/20/2017   Procedure: LEFT HEART CATH AND CORONARY ANGIOGRAPHY;  Surgeon: Martinique, Peter M, MD;  Location: [place] CV LAB;  Service: Cardiovascular;  Laterality: N/A;     Current Outpatient Medications  Medication Sig Dispense Refill   acetaminophen (TYLENOL) 325 MG tablet Take 2 tablets (650 mg total) by mouth every 6 (six) hours as needed for mild pain or headache.     atorvastatin (LIPITOR) 80 MG tablet Take 1 tablet (80 mg total) by mouth daily. 90 tablet 3   clopidogrel (PLAVIX) 75 MG tablet Take 1 tablet (75 mg total) by mouth daily. 90 tablet 3  lisinopril (ZESTRIL) 5 MG tablet Take 1 tablet (5 mg total) by mouth daily. 90 tablet 3   metoprolol tartrate (LOPRESSOR) 25 MG tablet Take 1 tablet (25 mg total) by mouth 2 (two) times daily. 180 tablet 3   nitroGLYCERIN (NITROSTAT) 0.4 MG SL tablet Place 1 tablet (0.4 mg total) under the tongue every 5 (five) minutes as needed for chest pain (CP or SOB). 25 tablet 6   tadalafil (CIALIS) 20 MG tablet Take 1 tablet (20 mg total) by mouth daily as needed for erectile dysfunction. 10 tablet 2   No current facility-administered medications for this visit.    Allergies:   Bee venom    Social History:  The patient  reports that he quit smoking about 5 years ago.  His smoking use included cigarettes. He has a 72.00 pack-year smoking history. He has never used smokeless tobacco. He reports current alcohol use of about 48.0 standard drinks of alcohol per week. He reports that he does not use drugs.   Family History:  The patient's family history includes CAD in his father.    ROS:  Please see the history of present illness.   Otherwise, review of systems are positive for weight gain.   All other systems are reviewed and negative.    PHYSICAL EXAM: VS:  BP 128/78   Pulse 84   Ht 6' (1.829 m)   Wt 257 lb 12.8 oz (116.9 kg)   SpO2 94%   BMI 34.96 kg/m  , BMI Body mass index is 34.96 kg/m. GEN: Well nourished, well developed, in no acute distress HEENT: normal Neck: no JVD, carotid bruits, or masses Cardiac: RRR; no murmurs, rubs, or gallops,no edema  Respiratory:  clear to auscultation bilaterally, normal work of breathing GI: soft, nontender, nondistended, + BS, obese MS: no deformity or atrophy Skin: warm and dry, no rash Neuro:  Strength and sensation are intact Psych: euthymic mood, full affect   EKG:   The ekg ordered today demonstrates NSR, inferior Q waves, no change from prior   Recent Labs: No results found for requested labs within last 365 days.   Lipid Panel    Component Value Date/Time   CHOL 101 04/23/2021 1005   TRIG 88 04/23/2021 1005   HDL 31 (L) 04/23/2021 1005   CHOLHDL 3.3 04/23/2021 1005   CHOLHDL 4.5 03/20/2017 0359   VLDL 15 03/20/2017 0359   LDLCALC 52 04/23/2021 1005     Other studies Reviewed: Additional studies/ records that were reviewed today with results demonstrating: labs reviewed from 2023.   ASSESSMENT AND PLAN:  CAD/old MI: Continue aggressive secondary prevention. Hypertension: Low-salt diet.  Avoid processed foods.  The current medical regimen is effective;  continue present plan and medications. Hyperlipidemia: Whole food, plant-based diet.  High-fiber diet.  Has cut back on red meat, but  not avoiding. Former smoker: Avoid all tobacco products.   Erectile dysfunction: Tried tadalafil in the past. Elevated blood glucose: Adding some resistance training may help keep blood sugar down. PreDM 5.9 A1C in 2023.  Still drinks sweet tea and sugar with coffee. Obesity: Dietary recommendations noted above.  Exercise recommendations noted below.  Avoiding processed sugar will be very important.  He is felt more strain on his knees as he has gained weight.  He understands the importance of weight loss.   Current medicines are reviewed at length with the patient today.  The patient concerns regarding his medicines were addressed.  The following changes have been made:  No  change  Labs/ tests ordered today include: Annual labs ordered today No orders of the defined types were placed in this encounter.   Recommend 150 minutes/week of aerobic exercise Low fat, low carb, high fiber diet recommended  Disposition:   FU in 1 year   Signed, Larae Grooms, MD  05/14/2022 9:11 AM    [facility] [place], [place], [place]  [address] Phone: [phone]; Fax: [phone]

## 2022-05-14 ENCOUNTER — Encounter: Payer: Self-pay | Admitting: Interventional Cardiology

## 2022-05-14 ENCOUNTER — Ambulatory Visit: Payer: Medicare Other | Attending: Interventional Cardiology | Admitting: Interventional Cardiology

## 2022-05-14 VITALS — BP 128/78 | HR 84 | Ht 72.0 in | Wt 257.8 lb

## 2022-05-14 DIAGNOSIS — N529 Male erectile dysfunction, unspecified: Secondary | ICD-10-CM | POA: Diagnosis not present

## 2022-05-14 DIAGNOSIS — I252 Old myocardial infarction: Secondary | ICD-10-CM

## 2022-05-14 DIAGNOSIS — Z87891 Personal history of nicotine dependence: Secondary | ICD-10-CM

## 2022-05-14 DIAGNOSIS — I25118 Atherosclerotic heart disease of native coronary artery with other forms of angina pectoris: Secondary | ICD-10-CM

## 2022-05-14 DIAGNOSIS — E782 Mixed hyperlipidemia: Secondary | ICD-10-CM | POA: Diagnosis not present

## 2022-05-14 DIAGNOSIS — R739 Hyperglycemia, unspecified: Secondary | ICD-10-CM

## 2022-05-14 NOTE — Patient Instructions (Signed)
Medication Instructions:  Your physician recommends that you continue on your current medications as directed. Please refer to the Current Medication list given to you today.  *If you need a refill on your cardiac medications before your next appointment, please call your pharmacy*   Lab Work: Lab work to be done today--CBC, CMET, Lipids, TSH, A1C If you have labs (blood work) drawn today and your tests are completely normal, you will receive your results only by: [place] (if you have MyChart) OR A paper copy in the mail If you have any lab test that is abnormal or we need to change your treatment, we will call you to review the results.   Testing/Procedures: none   Follow-Up: At [facility], you and your health needs are our priority.  As part of our continuing mission to provide you with exceptional heart care, we have created designated Provider Care Teams.  These Care Teams include your primary Cardiologist (physician) and Advanced Practice Providers (APPs -  Physician Assistants and Nurse Practitioners) who all work together to provide you with the care you need, when you need it.  We recommend signing up for the patient portal called "MyChart".  Sign up information is provided on this After Visit Summary.  MyChart is used to connect with patients for Virtual Visits (Telemedicine).  Patients are able to view lab/test results, encounter notes, upcoming appointments, etc.  Non-urgent messages can be sent to your provider as well.   To learn more about what you can do with MyChart, go to [email or web address].    Your next appointment:   12 month(s)  Provider:   Larae Grooms, MD     Other Instructions

## 2022-05-15 LAB — COMPREHENSIVE METABOLIC PANEL
ALT: 31 IU/L (ref 0–44)
AST: 24 IU/L (ref 0–40)
Albumin/Globulin Ratio: 1.8 (ref 1.2–2.2)
Albumin: 4.4 g/dL (ref 3.9–4.9)
Alkaline Phosphatase: 76 IU/L (ref 44–121)
BUN/Creatinine Ratio: 12 (ref 10–24)
BUN: 16 mg/dL (ref 8–27)
Bilirubin Total: 0.5 mg/dL (ref 0.0–1.2)
CO2: 24 mmol/L (ref 20–29)
Calcium: 9.1 mg/dL (ref 8.6–10.2)
Chloride: 105 mmol/L (ref 96–106)
Creatinine, Ser: 1.36 mg/dL — ABNORMAL HIGH (ref 0.76–1.27)
Globulin, Total: 2.5 g/dL (ref 1.5–4.5)
Glucose: 106 mg/dL — ABNORMAL HIGH (ref 70–99)
Potassium: 4.6 mmol/L (ref 3.5–5.2)
Sodium: 142 mmol/L (ref 134–144)
Total Protein: 6.9 g/dL (ref 6.0–8.5)
eGFR: 56 mL/min/{1.73_m2} — ABNORMAL LOW (ref >59)

## 2022-05-15 LAB — HEMOGLOBIN A1C
Est. average glucose Bld gHb Est-mCnc: 134 mg/dL
Hgb A1c MFr Bld: 6.3 % — ABNORMAL HIGH (ref 4.8–5.6)

## 2022-05-15 LAB — CBC
Hematocrit: 41.7 % (ref 37.5–51.0)
Hemoglobin: 14.1 g/dL (ref 13.0–17.7)
MCH: 31.7 pg (ref 26.6–33.0)
MCHC: 33.8 g/dL (ref 31.5–35.7)
MCV: 94 fL (ref 79–97)
Platelets: 200 10*3/uL (ref 150–450)
RBC: 4.45 x10E6/uL (ref 4.14–5.80)
RDW: 13.3 % (ref 11.6–15.4)
WBC: 5.7 10*3/uL (ref 3.4–10.8)

## 2022-05-15 LAB — LIPID PANEL
Chol/HDL Ratio: 3.7 ratio (ref 0.0–5.0)
Cholesterol, Total: 108 mg/dL (ref 100–199)
HDL: 29 mg/dL — ABNORMAL LOW (ref >39)
LDL Chol Calc (NIH): 54 mg/dL (ref 0–99)
Triglycerides: 141 mg/dL (ref 0–149)
VLDL Cholesterol Cal: 25 mg/dL (ref 5–40)

## 2022-05-15 LAB — TSH: TSH: 1.5 u[IU]/mL (ref 0.450–4.500)

## 2022-06-04 ENCOUNTER — Telehealth: Payer: Self-pay | Admitting: Interventional Cardiology

## 2022-06-04 DIAGNOSIS — Z136 Encounter for screening for cardiovascular disorders: Secondary | ICD-10-CM

## 2022-06-04 DIAGNOSIS — Z87891 Personal history of nicotine dependence: Secondary | ICD-10-CM

## 2022-06-04 NOTE — Telephone Encounter (Addendum)
Pt called to report that his brother advised him that he has an AAA 8.8 and will be having surgery.. he is 72 years old... and was told that he needs to alert all of his siblings... he is asking if he can have a "scan" to see if he has this developing as well.. I advised him that I will forward to him for his review.

## 2022-06-04 NOTE — Telephone Encounter (Signed)
OK to order medicare screening u/s for AAA

## 2022-06-04 NOTE — Telephone Encounter (Signed)
Pt advised and will forward to [facility] to determine the proper order for the medicare "screening".   Will ask Missy to be sure ordering the correct screening order.

## 2022-06-04 NOTE — Telephone Encounter (Signed)
Patient calling with some concerns, following some new that his brother received about his heart. Please advise

## 2022-06-11 ENCOUNTER — Other Ambulatory Visit: Payer: Self-pay | Admitting: Interventional Cardiology

## 2022-06-19 ENCOUNTER — Ambulatory Visit (HOSPITAL_COMMUNITY)
Admission: RE | Admit: 2022-06-19 | Discharge: 2022-06-19 | Disposition: A | Discharge status: Home / Self Care | Payer: Medicare Other | Source: Ambulatory Visit | Attending: Interventional Cardiology | Admitting: Interventional Cardiology

## 2022-06-19 DIAGNOSIS — Z136 Encounter for screening for cardiovascular disorders: Secondary | ICD-10-CM

## 2022-06-19 DIAGNOSIS — Z87891 Personal history of nicotine dependence: Secondary | ICD-10-CM

## 2022-06-20 ENCOUNTER — Other Ambulatory Visit: Payer: Self-pay | Admitting: *Deleted

## 2022-06-20 DIAGNOSIS — I714 Abdominal aortic aneurysm, without rupture, unspecified: Secondary | ICD-10-CM

## 2023-04-17 ENCOUNTER — Encounter: Payer: Self-pay | Admitting: Cardiology

## 2023-04-17 ENCOUNTER — Ambulatory Visit: Payer: Medicare Other | Attending: Cardiology | Admitting: Cardiology

## 2023-04-17 VITALS — BP 136/76 | HR 72 | Resp 16 | Ht 72.0 in | Wt 261.8 lb

## 2023-04-17 DIAGNOSIS — E7800 Pure hypercholesterolemia, unspecified: Secondary | ICD-10-CM | POA: Diagnosis not present

## 2023-04-17 DIAGNOSIS — I7143 Infrarenal abdominal aortic aneurysm, without rupture: Secondary | ICD-10-CM | POA: Diagnosis not present

## 2023-04-17 DIAGNOSIS — R739 Hyperglycemia, unspecified: Secondary | ICD-10-CM

## 2023-04-17 DIAGNOSIS — I25118 Atherosclerotic heart disease of native coronary artery with other forms of angina pectoris: Secondary | ICD-10-CM | POA: Diagnosis not present

## 2023-04-17 DIAGNOSIS — Z125 Encounter for screening for malignant neoplasm of prostate: Secondary | ICD-10-CM

## 2023-04-17 DIAGNOSIS — I10 Essential (primary) hypertension: Secondary | ICD-10-CM | POA: Diagnosis not present

## 2023-04-17 NOTE — Progress Notes (Signed)
 Cardiology Office Note:  .   Date:  04/18/2023  ID:  Garrett Robertson, DOB [birth year], MRN [id number] PCP: Yates Decamp, MD  [facility] Providers Cardiologist:  None   History of Present Illness: .   Garrett Robertson is a 73 y.o. Male patient with coronary artery disease and NSTEMI in 2019 SP mid RCA Synergy stent, primary hypertension, hypercholesterolemia, a moderate-sized infrarenal abdominal aortic aneurysm measuring 3.9 cm erectile dysfunction, hyperglycemia presents here for annual visit.  He is asymptomatic.  Discussed the use of AI scribe software for clinical note transcription with the patient, who gave verbal consent to proceed.  History of Present Illness   The patient, a 73 year old with a history of mild hypertension, mild hypercholesterolemia, and a myocardial infarction in 2019, presents for a routine follow-up. He had a stent placed in the right coronary artery at the time of the heart attack and had minor blockages elsewhere. He also has a small abdominal aortic aneurysm, discovered during a screening prompted by a sibling's diagnosis of a large aneurysm. The patient is active, riding motorcycles and fishing, and denies any chest pain or shortness of breath. He has not seen a primary care doctor in many years, stating he is "never sick." He has not had a prostate check or colonoscopy in over ten years. He also mentions a persistent chest cold.       Labs   Lab Results  Component Value Date   CHOL 113 04/17/2023   HDL 34 (L) 04/17/2023   LDLCALC 60 04/17/2023   TRIG 99 04/17/2023   CHOLHDL 3.3 04/17/2023   Lab Results  Component Value Date   NA 139 04/17/2023   K 4.5 04/17/2023   CO2 23 04/17/2023   GLUCOSE 104 (H) 04/17/2023   BUN 18 04/17/2023   CREATININE 1.55 (H) 04/17/2023   CALCIUM 9.5 04/17/2023   EGFR 48 (L) 04/17/2023   GFRNONAA 52 (L) 03/21/2020      Latest Ref Rng & Units 04/17/2023   10:55 AM 05/14/2022    9:29 AM 04/23/2021   10:05 AM  BMP  Glucose 70  - 99 mg/dL 440  347  425   BUN 8 - 27 mg/dL 18  16  19    Creatinine 0.76 - 1.27 mg/dL 9.56  3.87  5.64   BUN/Creat Ratio 10 - 24 12  12  14    Sodium 134 - 144 mmol/L 139  142  142   Potassium 3.5 - 5.2 mmol/L 4.5  4.6  5.0   Chloride 96 - 106 mmol/L 103  105  104   CO2 20 - 29 mmol/L 23  24  26    Calcium 8.6 - 10.2 mg/dL 9.5  9.1  9.2       Latest Ref Rng & Units 04/17/2023   10:55 AM 05/14/2022    9:29 AM 04/23/2021   10:05 AM  CBC  WBC 3.4 - 10.8 x10E3/uL 6.2  5.7  6.3   Hemoglobin 13.0 - 17.7 g/dL 33.2  95.1  88.4   Hematocrit 37.5 - 51.0 % 42.6  41.7  38.7   Platelets 150 - 450 x10E3/uL 206  200  207    Lab Results  Component Value Date   HGBA1C 6.0 (H) 04/17/2023    Lab Results  Component Value Date   TSH 1.500 05/14/2022   Review of Systems  Cardiovascular:  Negative for chest pain, dyspnea on exertion and leg swelling.   Physical Exam:   VS:  BP  136/76 (BP Location: Left Arm, Patient Position: Sitting, Cuff Size: Large)   Pulse 72   Resp 16   Ht 6' (1.829 m)   Wt 261 lb 12.8 oz (118.8 kg)   SpO2 96%   BMI 35.51 kg/m    Wt Readings from Last 3 Encounters:  04/17/23 261 lb 12.8 oz (118.8 kg)  05/14/22 257 lb 12.8 oz (116.9 kg)  04/11/21 253 lb (114.8 kg)    Physical Exam Constitutional:      Appearance: He is obese.  Neck:     Vascular: No carotid bruit or JVD.  Cardiovascular:     Rate and Rhythm: Normal rate and regular rhythm.     Pulses: Intact distal pulses.     Heart sounds: Normal heart sounds. No murmur heard.    No gallop.  Pulmonary:     Effort: Pulmonary effort is normal.     Breath sounds: Normal breath sounds.  Abdominal:     General: Bowel sounds are normal.     Palpations: Abdomen is soft.  Musculoskeletal:     Right lower leg: No edema.     Left lower leg: No edema.    Studies Reviewed: Marland Kitchen    Coronary angiogram 03/20/2017:  STENT SYNERGY DES 3X16     Abdominal attic duplex 06/19/2022: Fusiform dilatation of the mid and distal  abdominal aorta, abdominal attic aneurysm measuring 3.9 x 3.9 cm. Abnormal dilatation of the right common iliac artery and left common iliac artery measuring 2.4 cm bilaterally.  EKG:    EKG Interpretation Date/Time:  Friday April 17 2023 09:30:15 EST Ventricular Rate:  69 PR Interval:  182 QRS Duration:  96 QT Interval:  378 QTC Calculation: 405 R Axis:   -10  Text Interpretation: EKG 04/17/2023: Normal sinus rhythm at the rate of 69 bpm, inferior infarct old.  No evidence of ischemia.  No significant change from 03/21/2017. Reconfirmed by Delrae Rend [phone]) on 04/17/2023 9:59:26 AM    Medications and allergies    Allergies  Allergen Reactions   Bee Venom      Current Outpatient Medications:    acetaminophen (TYLENOL) 325 MG tablet, Take 2 tablets (650 mg total) by mouth every 6 (six) hours as needed for mild pain or headache., Disp: , Rfl:    atorvastatin (LIPITOR) 80 MG tablet, Take 1 tablet by mouth once daily, Disp: 90 tablet, Rfl: 3   clopidogrel (PLAVIX) 75 MG tablet, Take 1 tablet by mouth once daily, Disp: 90 tablet, Rfl: 3   lisinopril (ZESTRIL) 5 MG tablet, Take 1 tablet by mouth once daily, Disp: 90 tablet, Rfl: 3   metoprolol tartrate (LOPRESSOR) 25 MG tablet, Take 1 tablet by mouth twice daily, Disp: 180 tablet, Rfl: 3   nitroGLYCERIN (NITROSTAT) 0.4 MG SL tablet, Place 1 tablet (0.4 mg total) under the tongue every 5 (five) minutes as needed for chest pain (CP or SOB)., Disp: 25 tablet, Rfl: 6   tadalafil (CIALIS) 20 MG tablet, Take 1 tablet (20 mg total) by mouth daily as needed for erectile dysfunction., Disp: 10 tablet, Rfl: 2   ASSESSMENT AND PLAN: .      ICD-10-CM   1. Coronary artery disease of native artery of native heart with stable angina pectoris (HCC)  I25.118 EKG 12-Lead    CBC    Comprehensive metabolic panel    PSA    Hemoglobin A1c    Lipid panel    Comprehensive metabolic panel    CBC    Comprehensive metabolic  panel    CBC    2.  Primary hypertension  I10     3. Pure hypercholesterolemia  E78.00 Lipid panel    Lipid panel    4. Screening for prostate cancer  Z12.5 PSA    5. Hyperglycemia  R73.9 Hemoglobin A1c    Hemoglobin A1c    6. Infrarenal abdominal aortic aneurysm (AAA) without rupture (HCC)  I71.43       Assessment and Plan    Coronary Artery Disease (CAD)   A small myocardial infarction occurred in 2019, with a stent placed in the right coronary artery. The EKG shows an old infarction with no new ischemic changes. There is no current chest pain or dyspnea. Last year's LDL was 54, and he is on Lipitor 80 mg. Continue Lipitor 80 mg. No additional cardiac testing is needed.  Abdominal Aortic Aneurysm   A 3.9 cm aneurysm was identified last year, requiring monitoring due to a family history of larger aneurysms. A repeat abdominal duplex scan is scheduled within the next month or two. Schedule the scan within two months and determine future monitoring based on the results.  Hypertension   Blood pressure, likely contributing to chronic kidney disease. Continue current antihypertensive medications and monitor blood pressure regularly.  Stage 3A Chronic Kidney Disease   Kidney function is mildly reduced with creatinine around 1.3 since 2022, likely related to hypertension. There are no acute changes. Monitor kidney function regularly and encourage weight loss to improve kidney function.  Hyperlipidemia   Cholesterol levels are mildly elevated. Last year's LDL was 54, and he is on Lipitor 80 mg. Continue Lipitor 80 mg and monitor the lipid panel regularly.  Elevated Blood Sugar   Blood sugar levels are slightly elevated but not in the diabetic range, a consistent finding over the years. Encourage weight loss to manage blood sugar levels and monitor him regularly.  Presently on lisinopril 5 mg and metoprolol tartrate 25 mg twice daily with good control of blood pressure and also renal function has remained  stable.  General Health Maintenance   He has no primary care physician. The last prostate check was over 10 years ago, and there has been no recent colonoscopy. Order CBC, lipid panel, PSA, and A1c. Recommend establishing care with a primary care physician for routine screenings, including a prostate exam and colonoscopy.  Follow-up   Schedule a follow-up visit in one year. Ensure the abdominal duplex scan is completed within two months. Coordinate follow-up to avoid conflicts with motorcycle week.           Signed,  Yates Decamp, MD, [facility] 04/18/2023, 3:20 PM [facility] [address] [place] [place], [place] [address] Phone: [phone]. Fax:  [phone]

## 2023-04-17 NOTE — Patient Instructions (Signed)
 Medication Instructions:  Your physician recommends that you continue on your current medications as directed. Please refer to the Current Medication list given to you today.  *If you need a refill on your cardiac medications before your next appointment, please call your pharmacy*   Lab Work: CBC, CMET, Lipids, PSA, and A1C today - please have this completed at [facility] on the first floor  If you have labs (blood work) drawn today and your tests are completely normal, you will receive your results only by: MyChart Message (if you have MyChart) OR A paper copy in the mail If you have any lab test that is abnormal or we need to change your treatment, we will call you to review the results.   Follow-Up: At [facility], you and your health needs are our priority.  As part of our continuing mission to provide you with exceptional heart care, we have created designated Provider Care Teams.  These Care Teams include your primary Cardiologist (physician) and Advanced Practice Providers (APPs -  Physician Assistants and Nurse Practitioners) who all work together to provide you with the care you need, when you need it.  We recommend signing up for the patient portal called "MyChart".  Sign up information is provided on this After Visit Summary.  MyChart is used to connect with patients for Virtual Visits (Telemedicine).  Patients are able to view lab/test results, encounter notes, upcoming appointments, etc.  Non-urgent messages can be sent to your provider as well.   To learn more about what you can do with MyChart, go to [email or web address].    Your next appointment:   1 year(s)  Provider:   Dr Jacinto Halim

## 2023-04-18 ENCOUNTER — Encounter: Payer: Self-pay | Admitting: Cardiology

## 2023-04-18 LAB — CBC
Hematocrit: 42.6 % (ref 37.5–51.0)
Hemoglobin: 14.3 g/dL (ref 13.0–17.7)
MCH: 31.5 pg (ref 26.6–33.0)
MCHC: 33.6 g/dL (ref 31.5–35.7)
MCV: 94 fL (ref 79–97)
Platelets: 206 10*3/uL (ref 150–450)
RBC: 4.54 x10E6/uL (ref 4.14–5.80)
RDW: 13.6 % (ref 11.6–15.4)
WBC: 6.2 10*3/uL (ref 3.4–10.8)

## 2023-04-18 LAB — COMPREHENSIVE METABOLIC PANEL
ALT: 28 IU/L (ref 0–44)
AST: 18 IU/L (ref 0–40)
Albumin: 4.6 g/dL (ref 3.8–4.8)
Alkaline Phosphatase: 84 IU/L (ref 44–121)
BUN/Creatinine Ratio: 12 (ref 10–24)
BUN: 18 mg/dL (ref 8–27)
Bilirubin Total: 0.5 mg/dL (ref 0.0–1.2)
CO2: 23 mmol/L (ref 20–29)
Calcium: 9.5 mg/dL (ref 8.6–10.2)
Chloride: 103 mmol/L (ref 96–106)
Creatinine, Ser: 1.55 mg/dL — ABNORMAL HIGH (ref 0.76–1.27)
Globulin, Total: 2.5 g/dL (ref 1.5–4.5)
Glucose: 104 mg/dL — ABNORMAL HIGH (ref 70–99)
Potassium: 4.5 mmol/L (ref 3.5–5.2)
Sodium: 139 mmol/L (ref 134–144)
Total Protein: 7.1 g/dL (ref 6.0–8.5)
eGFR: 48 mL/min/{1.73_m2} — ABNORMAL LOW (ref >59)

## 2023-04-18 LAB — LIPID PANEL
Chol/HDL Ratio: 3.3 ratio (ref 0.0–5.0)
Cholesterol, Total: 113 mg/dL (ref 100–199)
HDL: 34 mg/dL — ABNORMAL LOW (ref >39)
LDL Chol Calc (NIH): 60 mg/dL (ref 0–99)
Triglycerides: 99 mg/dL (ref 0–149)
VLDL Cholesterol Cal: 19 mg/dL (ref 5–40)

## 2023-04-18 LAB — PSA: Prostate Specific Ag, Serum: 0.5 ng/mL (ref 0.0–4.0)

## 2023-04-18 LAB — HEMOGLOBIN A1C
Est. average glucose Bld gHb Est-mCnc: 126 mg/dL
Hgb A1c MFr Bld: 6 % — ABNORMAL HIGH (ref 4.8–5.6)

## 2023-04-18 NOTE — Progress Notes (Signed)
 All your labs are within normal limits, prediabetes is stable and mild chronic kidney disease is also stable

## 2023-06-03 ENCOUNTER — Telehealth: Payer: Self-pay | Admitting: Cardiology

## 2023-06-03 MED ORDER — METOPROLOL TARTRATE 25 MG PO TABS: 25.0000 mg | ORAL_TABLET | Freq: Two times a day (BID) | ORAL | 3 refills | Status: AC

## 2023-06-03 MED ORDER — LISINOPRIL 5 MG PO TABS: 5.0000 mg | ORAL_TABLET | Freq: Every day | ORAL | 3 refills | Status: AC

## 2023-06-03 MED ORDER — ATORVASTATIN CALCIUM 80 MG PO TABS: 80.0000 mg | ORAL_TABLET | Freq: Every day | ORAL | 3 refills | Status: AC

## 2023-06-03 MED ORDER — CLOPIDOGREL BISULFATE 75 MG PO TABS: 75.0000 mg | ORAL_TABLET | Freq: Every day | ORAL | 3 refills | Status: AC

## 2023-06-03 NOTE — Telephone Encounter (Signed)
*  STAT* If patient is at the pharmacy, call can be transferred to refill team.   1. Which medications need to be refilled? (please list name of each medication and dose if known)    atorvastatin  (LIPITOR ) 80 MG tablet Take 1 tablet by mouth once daily   clopidogrel  (PLAVIX ) 75 MG tablet Take 1 tablet by mouth once daily   lisinopril  (ZESTRIL ) 5 MG tablet Take 1 tablet by mouth once daily   metoprolol  tartrate (LOPRESSOR ) 25 MG tablet Take 1 tablet by mouth twice daily             4. Which pharmacy/location (including street and city if local pharmacy) is medication to be sent to?  [id number] - [place], [place] - [address]     5. Do they need a 30 day or 90 day supply? 90

## 2023-06-03 NOTE — Telephone Encounter (Signed)
 RX sent to requested Pharmacy

## 2023-06-15 ENCOUNTER — Ambulatory Visit (HOSPITAL_COMMUNITY)
Admission: RE | Admit: 2023-06-15 | Discharge: 2023-06-15 | Disposition: A | Discharge status: Home / Self Care | Source: Ambulatory Visit | Attending: Interventional Cardiology | Admitting: Interventional Cardiology

## 2023-06-15 DIAGNOSIS — I714 Abdominal aortic aneurysm, without rupture, unspecified: Secondary | ICD-10-CM

## 2023-06-16 ENCOUNTER — Ambulatory Visit (HOSPITAL_COMMUNITY): Admission: RE | Admit: 2023-06-16 | Source: Ambulatory Visit

## 2023-06-22 ENCOUNTER — Encounter: Payer: Self-pay | Admitting: Cardiology

## 2023-06-22 ENCOUNTER — Other Ambulatory Visit (HOSPITAL_COMMUNITY): Payer: Self-pay | Admitting: Cardiology

## 2023-06-22 DIAGNOSIS — I7142 Juxtarenal abdominal aortic aneurysm, without rupture: Secondary | ICD-10-CM

## 2023-06-22 NOTE — Progress Notes (Signed)
 No change in the size of the AAA. Will recheck in 1 year prior to next OV.

## 2023-06-22 NOTE — Progress Notes (Signed)
 ICD-10-CM   1. Juxtarenal abdominal aortic aneurysm (AAA) without rupture (HCC)  I71.42 VAS US  AAA DUPLEX

## 2023-06-25 ENCOUNTER — Ambulatory Visit: Payer: Self-pay | Admitting: *Deleted

## 2024-04-19 ENCOUNTER — Ambulatory Visit: Admitting: Cardiology
